# Patient Record
Sex: Male | Born: 1950
Health system: Southern US, Community
[De-identification: ages and names within clinical notes are randomized; demographics above are authoritative.]

## PROBLEM LIST (undated history)

## (undated) DIAGNOSIS — E7800 Pure hypercholesterolemia, unspecified: Secondary | ICD-10-CM

## (undated) DIAGNOSIS — Z6827 Body mass index (BMI) 27.0-27.9, adult: Secondary | ICD-10-CM

## (undated) DIAGNOSIS — K4090 Unilateral inguinal hernia, without obstruction or gangrene, not specified as recurrent: Secondary | ICD-10-CM

## (undated) DIAGNOSIS — I10 Essential (primary) hypertension: Secondary | ICD-10-CM

## (undated) DIAGNOSIS — M1990 Unspecified osteoarthritis, unspecified site: Secondary | ICD-10-CM

## (undated) DIAGNOSIS — D689 Coagulation defect, unspecified: Secondary | ICD-10-CM

## (undated) DIAGNOSIS — G4730 Sleep apnea, unspecified: Secondary | ICD-10-CM

## (undated) DIAGNOSIS — Z801 Family history of malignant neoplasm of trachea, bronchus and lung: Secondary | ICD-10-CM

## (undated) DIAGNOSIS — K5790 Diverticulosis of intestine, part unspecified, without perforation or abscess without bleeding: Secondary | ICD-10-CM

## (undated) DIAGNOSIS — D67 Hereditary factor IX deficiency: Secondary | ICD-10-CM

## (undated) DIAGNOSIS — Z86010 Personal history of colonic polyps: Secondary | ICD-10-CM

## (undated) DIAGNOSIS — Z860101 Personal history of adenomatous and serrated colon polyps: Secondary | ICD-10-CM

## (undated) HISTORY — DX: Unilateral inguinal hernia, without obstruction or gangrene, not specified as recurrent: K40.90

## (undated) HISTORY — DX: Unspecified osteoarthritis, unspecified site: M19.90

## (undated) HISTORY — DX: Hereditary factor IX deficiency: D67

## (undated) HISTORY — DX: Coagulation defect, unspecified: D68.9

## (undated) HISTORY — DX: Essential (primary) hypertension: I10

## (undated) HISTORY — DX: Body mass index (BMI) 27.0-27.9, adult: Z68.27

## (undated) HISTORY — DX: Personal history of adenomatous and serrated colon polyps: Z86.0101

## (undated) HISTORY — DX: Diverticulosis of intestine, part unspecified, without perforation or abscess without bleeding: K57.90

## (undated) HISTORY — PX: COLONOSCOPY: SHX174

## (undated) HISTORY — PX: TONSILLECTOMY: SUR1361

## (undated) HISTORY — DX: Family history of malignant neoplasm of trachea, bronchus and lung: Z80.1

## (undated) HISTORY — PX: POLYPECTOMY: SHX149

## (undated) HISTORY — DX: Personal history of colonic polyps: Z86.010

## (undated) HISTORY — PX: VASECTOMY: SHX75

---

## 1998-09-16 ENCOUNTER — Ambulatory Visit (HOSPITAL_COMMUNITY): Admission: RE | Admit: 1998-09-16 | Discharge: 1998-09-16 | Payer: Self-pay | Admitting: Family Medicine

## 1999-01-27 ENCOUNTER — Encounter: Payer: Self-pay | Admitting: Family Medicine

## 1999-01-27 ENCOUNTER — Ambulatory Visit (HOSPITAL_COMMUNITY): Admission: RE | Admit: 1999-01-27 | Discharge: 1999-01-27 | Payer: Self-pay | Admitting: Family Medicine

## 2004-08-01 ENCOUNTER — Ambulatory Visit: Payer: Self-pay | Admitting: Oncology

## 2004-09-14 ENCOUNTER — Ambulatory Visit (HOSPITAL_COMMUNITY): Admission: RE | Admit: 2004-09-14 | Discharge: 2004-09-14 | Payer: Self-pay | Admitting: Internal Medicine

## 2004-09-14 ENCOUNTER — Ambulatory Visit: Payer: Self-pay | Admitting: Internal Medicine

## 2004-09-14 DIAGNOSIS — K648 Other hemorrhoids: Secondary | ICD-10-CM

## 2004-09-14 HISTORY — DX: Other hemorrhoids: K64.8

## 2004-10-11 ENCOUNTER — Ambulatory Visit: Payer: Self-pay | Admitting: Oncology

## 2005-03-14 ENCOUNTER — Ambulatory Visit: Payer: Self-pay | Admitting: Oncology

## 2007-12-21 ENCOUNTER — Ambulatory Visit: Payer: Self-pay | Admitting: Gastroenterology

## 2008-01-15 ENCOUNTER — Ambulatory Visit: Payer: Self-pay | Admitting: Gastroenterology

## 2008-01-15 ENCOUNTER — Encounter: Payer: Self-pay | Admitting: Gastroenterology

## 2008-01-21 ENCOUNTER — Encounter: Payer: Self-pay | Admitting: Gastroenterology

## 2008-01-27 ENCOUNTER — Ambulatory Visit: Payer: Self-pay | Admitting: Gastroenterology

## 2008-01-27 ENCOUNTER — Telehealth: Payer: Self-pay | Admitting: Gastroenterology

## 2008-01-27 DIAGNOSIS — K644 Residual hemorrhoidal skin tags: Secondary | ICD-10-CM

## 2008-01-27 DIAGNOSIS — Z8639 Personal history of other endocrine, nutritional and metabolic disease: Secondary | ICD-10-CM

## 2008-01-27 DIAGNOSIS — K625 Hemorrhage of anus and rectum: Secondary | ICD-10-CM

## 2008-01-27 DIAGNOSIS — I10 Essential (primary) hypertension: Secondary | ICD-10-CM

## 2008-01-27 DIAGNOSIS — Z862 Personal history of diseases of the blood and blood-forming organs and certain disorders involving the immune mechanism: Secondary | ICD-10-CM | POA: Insufficient documentation

## 2008-01-27 HISTORY — DX: Essential (primary) hypertension: I10

## 2008-01-27 HISTORY — DX: Hemorrhage of anus and rectum: K62.5

## 2008-01-27 HISTORY — DX: Residual hemorrhoidal skin tags: K64.4

## 2008-01-27 LAB — CONVERTED CEMR LAB
Basophils Absolute: 0.1 10*3/uL (ref 0.0–0.1)
Basophils Relative: 1.3 % (ref 0.0–3.0)
Eosinophils Absolute: 0.1 10*3/uL (ref 0.0–0.7)
Eosinophils Relative: 1.7 % (ref 0.0–5.0)
HCT: 42 % (ref 39.0–52.0)
Hemoglobin: 14.4 g/dL (ref 13.0–17.0)
Lymphocytes Relative: 29.4 % (ref 12.0–46.0)
MCHC: 34.3 g/dL (ref 30.0–36.0)
MCV: 89.1 fL (ref 78.0–100.0)
Monocytes Absolute: 0.5 10*3/uL (ref 0.1–1.0)
Monocytes Relative: 9.5 % (ref 3.0–12.0)
Neutro Abs: 3 10*3/uL (ref 1.4–7.7)
Neutrophils Relative %: 58.1 % (ref 43.0–77.0)
Platelets: 241 10*3/uL (ref 150–400)
RBC: 4.71 M/uL (ref 4.22–5.81)
RDW: 12.3 % (ref 11.5–14.6)
WBC: 5.2 10*3/uL (ref 4.5–10.5)

## 2010-11-07 ENCOUNTER — Emergency Department (HOSPITAL_COMMUNITY): Payer: 59

## 2010-11-07 ENCOUNTER — Emergency Department (HOSPITAL_COMMUNITY)
Admission: EM | Admit: 2010-11-07 | Discharge: 2010-11-07 | Disposition: A | Discharge status: Home / Self Care | Payer: 59 | Attending: Emergency Medicine | Admitting: Emergency Medicine

## 2010-11-07 DIAGNOSIS — R10819 Abdominal tenderness, unspecified site: Secondary | ICD-10-CM | POA: Insufficient documentation

## 2010-11-07 DIAGNOSIS — R141 Gas pain: Secondary | ICD-10-CM | POA: Insufficient documentation

## 2010-11-07 DIAGNOSIS — R142 Eructation: Secondary | ICD-10-CM | POA: Insufficient documentation

## 2010-11-07 DIAGNOSIS — Z79899 Other long term (current) drug therapy: Secondary | ICD-10-CM | POA: Insufficient documentation

## 2010-11-07 DIAGNOSIS — E7800 Pure hypercholesterolemia, unspecified: Secondary | ICD-10-CM | POA: Insufficient documentation

## 2010-11-07 DIAGNOSIS — R109 Unspecified abdominal pain: Secondary | ICD-10-CM | POA: Insufficient documentation

## 2010-11-07 DIAGNOSIS — I10 Essential (primary) hypertension: Secondary | ICD-10-CM | POA: Insufficient documentation

## 2010-11-07 DIAGNOSIS — M549 Dorsalgia, unspecified: Secondary | ICD-10-CM | POA: Insufficient documentation

## 2010-11-07 LAB — COMPREHENSIVE METABOLIC PANEL
ALT: 27 U/L (ref 0–53)
AST: 23 U/L (ref 0–37)
Alkaline Phosphatase: 63 U/L (ref 39–117)
CO2: 23 mEq/L (ref 19–32)
Chloride: 103 mEq/L (ref 96–112)
Creatinine, Ser: 0.98 mg/dL (ref 0.50–1.35)
GFR calc non Af Amer: 89 mL/min — ABNORMAL LOW (ref >90)
Total Bilirubin: 1.6 mg/dL — ABNORMAL HIGH (ref 0.3–1.2)

## 2010-11-07 LAB — DIFFERENTIAL
Basophils Absolute: 0 10*3/uL (ref 0.0–0.1)
Basophils Relative: 0 % (ref 0–1)
Eosinophils Absolute: 0.1 10*3/uL (ref 0.0–0.7)
Eosinophils Relative: 1 % (ref 0–5)
Lymphocytes Relative: 11 % — ABNORMAL LOW (ref 12–46)
Lymphs Abs: 1.1 10*3/uL (ref 0.7–4.0)
Monocytes Absolute: 0.6 10*3/uL (ref 0.1–1.0)
Monocytes Relative: 6 % (ref 3–12)
Neutro Abs: 8.2 10*3/uL — ABNORMAL HIGH (ref 1.7–7.7)
Neutrophils Relative %: 82 % — ABNORMAL HIGH (ref 43–77)

## 2010-11-07 LAB — CBC
MCV: 87.7 fL (ref 78.0–100.0)
Platelets: 239 10*3/uL (ref 150–400)
RBC: 5.14 MIL/uL (ref 4.22–5.81)
WBC: 10 10*3/uL (ref 4.0–10.5)

## 2010-11-07 LAB — URINALYSIS, ROUTINE W REFLEX MICROSCOPIC
Bilirubin Urine: NEGATIVE
Glucose, UA: NEGATIVE mg/dL
Hgb urine dipstick: NEGATIVE
Ketones, ur: NEGATIVE mg/dL
Leukocytes, UA: NEGATIVE
Nitrite: NEGATIVE
Protein, ur: NEGATIVE mg/dL
Specific Gravity, Urine: 1.021 (ref 1.005–1.030)
Urobilinogen, UA: 0.2 mg/dL (ref 0.0–1.0)
pH: 6 (ref 5.0–8.0)

## 2010-11-07 MED ORDER — IOHEXOL 300 MG/ML  SOLN
80.0000 mL | Freq: Once | INTRAMUSCULAR | Status: AC | PRN
  Administered 2010-11-07: 80 mL via INTRAVENOUS

## 2010-11-09 ENCOUNTER — Emergency Department (HOSPITAL_COMMUNITY)
Admission: EM | Admit: 2010-11-09 | Discharge: 2010-11-09 | Disposition: A | Discharge status: Home / Self Care | Payer: 59 | Attending: Emergency Medicine | Admitting: Emergency Medicine

## 2010-11-09 DIAGNOSIS — Z79899 Other long term (current) drug therapy: Secondary | ICD-10-CM | POA: Insufficient documentation

## 2010-11-09 DIAGNOSIS — E7800 Pure hypercholesterolemia, unspecified: Secondary | ICD-10-CM | POA: Insufficient documentation

## 2010-11-09 DIAGNOSIS — R109 Unspecified abdominal pain: Secondary | ICD-10-CM | POA: Insufficient documentation

## 2010-11-09 DIAGNOSIS — I10 Essential (primary) hypertension: Secondary | ICD-10-CM | POA: Insufficient documentation

## 2010-11-11 ENCOUNTER — Encounter: Payer: Self-pay | Admitting: *Deleted

## 2010-11-11 ENCOUNTER — Emergency Department (HOSPITAL_COMMUNITY)
Admission: EM | Admit: 2010-11-11 | Discharge: 2010-11-11 | Disposition: A | Discharge status: Home / Self Care | Payer: 59 | Attending: Emergency Medicine | Admitting: Emergency Medicine

## 2010-11-11 ENCOUNTER — Emergency Department (HOSPITAL_COMMUNITY): Payer: 59

## 2010-11-11 DIAGNOSIS — R109 Unspecified abdominal pain: Secondary | ICD-10-CM | POA: Insufficient documentation

## 2010-11-11 DIAGNOSIS — M549 Dorsalgia, unspecified: Secondary | ICD-10-CM | POA: Insufficient documentation

## 2010-11-11 DIAGNOSIS — K2970 Gastritis, unspecified, without bleeding: Secondary | ICD-10-CM | POA: Insufficient documentation

## 2010-11-11 DIAGNOSIS — K219 Gastro-esophageal reflux disease without esophagitis: Secondary | ICD-10-CM | POA: Insufficient documentation

## 2010-11-11 DIAGNOSIS — K828 Other specified diseases of gallbladder: Secondary | ICD-10-CM

## 2010-11-11 DIAGNOSIS — E7800 Pure hypercholesterolemia, unspecified: Secondary | ICD-10-CM | POA: Insufficient documentation

## 2010-11-11 DIAGNOSIS — I10 Essential (primary) hypertension: Secondary | ICD-10-CM | POA: Insufficient documentation

## 2010-11-11 DIAGNOSIS — K8020 Calculus of gallbladder without cholecystitis without obstruction: Secondary | ICD-10-CM | POA: Insufficient documentation

## 2010-11-11 DIAGNOSIS — Z79899 Other long term (current) drug therapy: Secondary | ICD-10-CM | POA: Insufficient documentation

## 2010-11-11 DIAGNOSIS — R110 Nausea: Secondary | ICD-10-CM | POA: Insufficient documentation

## 2010-11-11 HISTORY — DX: Essential (primary) hypertension: I10

## 2010-11-11 HISTORY — DX: Pure hypercholesterolemia, unspecified: E78.00

## 2010-11-11 LAB — DIFFERENTIAL
Basophils Absolute: 0 10*3/uL (ref 0.0–0.1)
Eosinophils Relative: 2 % (ref 0–5)
Monocytes Absolute: 1.3 10*3/uL — ABNORMAL HIGH (ref 0.1–1.0)
Neutrophils Relative %: 73 % (ref 43–77)

## 2010-11-11 LAB — COMPREHENSIVE METABOLIC PANEL
AST: 15 U/L (ref 0–37)
BUN: 11 mg/dL (ref 6–23)
CO2: 31 mEq/L (ref 19–32)
Chloride: 99 mEq/L (ref 96–112)
Creatinine, Ser: 1.11 mg/dL (ref 0.50–1.35)
GFR calc Af Amer: 83 mL/min — ABNORMAL LOW (ref >90)
GFR calc non Af Amer: 71 mL/min — ABNORMAL LOW (ref >90)
Glucose, Bld: 119 mg/dL — ABNORMAL HIGH (ref 70–99)
Total Bilirubin: 1.6 mg/dL — ABNORMAL HIGH (ref 0.3–1.2)

## 2010-11-11 LAB — URINALYSIS, ROUTINE W REFLEX MICROSCOPIC
Glucose, UA: NEGATIVE mg/dL
Ketones, ur: NEGATIVE mg/dL
Leukocytes, UA: NEGATIVE
Protein, ur: NEGATIVE mg/dL
Urobilinogen, UA: 0.2 mg/dL (ref 0.0–1.0)

## 2010-11-11 LAB — CBC
MCH: 30.8 pg (ref 26.0–34.0)
MCHC: 35.1 g/dL (ref 30.0–36.0)
Platelets: 230 10*3/uL (ref 150–400)
RDW: 12.5 % (ref 11.5–15.5)

## 2010-11-11 MED ORDER — FAMOTIDINE IN NACL 20-0.9 MG/50ML-% IV SOLN
20.0000 mg | Freq: Once | INTRAVENOUS | Status: AC
  Administered 2010-11-11: 20 mg via INTRAVENOUS
  Filled 2010-11-11: qty 50

## 2010-11-11 MED ORDER — FAMOTIDINE 20 MG PO TABS: 20.0000 mg | ORAL_TABLET | Freq: Two times a day (BID) | ORAL | Status: DC

## 2010-11-11 MED ORDER — HYDROMORPHONE HCL PF 1 MG/ML IJ SOLN
1.0000 mg | Freq: Once | INTRAMUSCULAR | Status: AC
  Administered 2010-11-11: 1 mg via INTRAVENOUS
  Filled 2010-11-11: qty 1

## 2010-11-11 MED ORDER — GI COCKTAIL ~~LOC~~
30.0000 mL | Freq: Once | ORAL | Status: AC
  Administered 2010-11-11: 30 mL via ORAL
  Filled 2010-11-11: qty 30

## 2010-11-11 MED ORDER — OXYCODONE-ACETAMINOPHEN 5-325 MG PO TABS: 2.0000 | ORAL_TABLET | ORAL | Status: DC | PRN

## 2010-11-11 MED ORDER — PANTOPRAZOLE SODIUM 40 MG IV SOLR
40.0000 mg | Freq: Once | INTRAVENOUS | Status: AC
  Administered 2010-11-11: 40 mg via INTRAVENOUS
  Filled 2010-11-11: qty 40

## 2010-11-11 MED ORDER — POTASSIUM CHLORIDE CRYS ER 20 MEQ PO TBCR
40.0000 meq | EXTENDED_RELEASE_TABLET | Freq: Once | ORAL | Status: AC
  Administered 2010-11-11: 40 meq via ORAL
  Filled 2010-11-11: qty 2

## 2010-11-11 MED ORDER — OMEPRAZOLE 20 MG PO CPDR: 20.0000 mg | DELAYED_RELEASE_CAPSULE | Freq: Every day | ORAL | Status: DC

## 2010-11-11 NOTE — ED Notes (Signed)
Bowel sounds active in all quadrants. Pt c/o "bloating".

## 2010-11-11 NOTE — ED Notes (Signed)
Family at bedside. 

## 2010-11-11 NOTE — ED Notes (Signed)
MD at bedside. 

## 2010-11-11 NOTE — ED Notes (Signed)
Pt from triage to room #9. C/o abd pain since Tuesday with 2 previous trips to ED here and CT done. Had BM yesterday after taking laxative, dark brown formed with no blood. No BM today. Pain level 5/10 1 hour ago and took Rx from home oxycodone/acetaminophen 5/325 on own. Pain level now 2/10 in upper abd with radiation to back on both sides. C/o feeling bloated.

## 2010-11-11 NOTE — ED Notes (Signed)
Pt A&O x 4, Eyes PERRL, Skin warm and dry. Family at bedside, and toileting offered

## 2010-11-11 NOTE — ED Notes (Signed)
Was here wed for same, having abd pain and back pain, constipation, had ct scan done on wed.

## 2010-11-11 NOTE — Discharge Instructions (Signed)
Gastritis Gastritis is an inflammation (the body's way of reacting to injury and/or infection) of the stomach. It is often caused by viral or bacterial (germ) infections. It can also be caused by chemicals (including alcohol) and medications. This illness may be associated with generalized malaise (feeling tired, not well), cramps, and fever. The illness may last 2 to 7 days. If symptoms of gastritis continue, gastroscopy (looking into the stomach with a telescope-like instrument), biopsy (taking tissue samples), and/or blood tests may be necessary to determine the cause. Antibiotics will not affect the illness unless there is a bacterial infection present. One common bacterial cause of gastritis is an organism known as H. Pylori. This can be treated with antibiotics. Other forms of gastritis are caused by too much acid in the stomach. They can be treated with medications such as H2 blockers and antacids. Home treatment is usually all that is needed. Young children will quickly become dehydrated (loss of body fluids) if vomiting and diarrhea are both present. Medications may be given to control nausea. Medications are usually not given for diarrhea unless especially bothersome. Some medications slow the removal of the virus from the gastrointestinal tract. This slows down the healing process. HOME CARE INSTRUCTIONS Home care instructions for nausea and vomiting:  For adults: drink small amounts of fluids often. Drink at least 2 quarts a day. Take sips frequently. Do not drink large amounts of fluid at one time. This may worsen the nausea.   Only take over-the-counter or prescription medicines for pain, discomfort, or fever as directed by your caregiver.   Drink clear liquids only. Those are anything you can see through such as water, broth, or soft drinks.   Once you are keeping clear liquids down, you may start full liquids, soups, juices, and ice cream or sherbet. Slowly add bland (plain, not spicy)  foods to your diet.  Home care instructions for diarrhea:  Diarrhea can be caused by bacterial infections or a virus. Your condition should improve with time, rest, fluids, and/or anti-diarrheal medication.   Until your diarrhea is under control, you should drink clear liquids often in small amounts. Clear liquids include: water, broth, jell-o water and weak tea.  Avoid:  Milk.   Fruits.   Tobacco.   Alcohol.   Extremely hot or cold fluids.   Too much intake of anything at one time.  When your diarrhea stops you may add the following foods, which help the stool to become more formed:  Rice.   Bananas.   Apples without skin.   Dry toast.  Once these foods are tolerated you may add low-fat yogurt and low-fat cottage cheese. They will help to restore the normal bacterial balance in your bowel. Wash your hands well to avoid spreading bacteria (germ) or virus. SEEK IMMEDIATE MEDICAL CARE IF:   You are unable to keep fluids down.   Vomiting or diarrhea become persistent (constant).   Abdominal pain develops, increases, or localizes. (Right sided pain can be appendicitis. Left sided pain in adults can be diverticulitis.)   You develop a fever (an oral temperature above 102 F (38.9 C)).   Diarrhea becomes excessive or contains blood or mucus.   You have excessive weakness, dizziness, fainting or extreme thirst.   You are not improving or you are getting worse.   You have any other questions or concerns.  Document Released: 12/18/2000 Document Revised: 09/05/2010 Document Reviewed: 12/24/2004 [facility] Patient Information 2012 [place], [place].  Buy an entire 256 g bottle of MiraLAX  and insert the entire thing into a 64 ounce Gatorade and drink the entire thing to resolve your constipation. After completing this take MiraLAX once or twice daily

## 2010-11-12 NOTE — ED Provider Notes (Signed)
History     CSN: [id number] Arrival date & time: 11/11/2010  3:58 PM   First MD Initiated Contact with Patient 11/11/10 1757      Chief Complaint  Patient presents with  . Abdominal Pain  . Back Pain    (Consider location/radiation/quality/duration/timing/severity/associated sxs/prior treatment) HPI Comments: Patient has been seen 2 times prior in the emergency department for this complaint. He's had a CT scan which was negative. Has been taking Percocet with some improvement of the symptoms. Presents for further evaluation to determine the exact etiology of abdominal pain. States he does have some constipation.  Patient is a 60 y.o. male presenting with abdominal pain. The history is provided by the patient. No language interpreter was used.  Abdominal Pain The primary symptoms of the illness include abdominal pain and nausea. The primary symptoms of the illness do not include fatigue, shortness of breath, vomiting, diarrhea or dysuria. The current episode started more than 2 days ago (1 week ago). The onset of the illness was gradual. The problem has been gradually worsening.  The abdominal pain began more than 2 days ago. The pain came on gradually. The abdominal pain has been gradually worsening since its onset. The abdominal pain is located in the epigastric region. The abdominal pain radiates to the back. The abdominal pain is relieved by nothing. The abdominal pain is exacerbated by certain positions.  The patient has not had a change in bowel habit. Additional symptoms associated with the illness include back pain. Symptoms associated with the illness do not include chills, anorexia, diaphoresis, constipation, urgency or frequency. Significant associated medical issues include GERD. Significant associated medical issues do not include inflammatory bowel disease, diabetes, gallstones, liver disease or cardiac disease.    Past Medical History  Diagnosis Date  . High cholesterol   .  Hypertension     History reviewed. No pertinent past surgical history.  History reviewed. No pertinent family history.  History  Substance Use Topics  . Smoking status: Never Smoker   . Smokeless tobacco: Not on file  . Alcohol Use: 6.0 oz/week    10 Glasses of wine per week      Review of Systems  Constitutional: Negative for chills, diaphoresis, activity change, appetite change and fatigue.  HENT: Negative for congestion, sore throat, rhinorrhea, neck pain and neck stiffness.   Respiratory: Negative for cough and shortness of breath.   Cardiovascular: Negative for chest pain and palpitations.  Gastrointestinal: Positive for nausea and abdominal pain. Negative for vomiting, diarrhea, constipation, abdominal distention and anorexia.  Genitourinary: Negative for dysuria, urgency, frequency and flank pain.  Musculoskeletal: Positive for back pain. Negative for myalgias and arthralgias.  Neurological: Negative for dizziness, weakness, light-headedness, numbness and headaches.  All other systems reviewed and are negative.    Allergies  Penicillins  Home Medications   Current Outpatient Rx  Name Route Sig Dispense Refill  . ATORVASTATIN CALCIUM 40 MG PO TABS Oral Take 40 mg by mouth every morning.      . OXYCODONE-ACETAMINOPHEN 5-325 MG PO TABS Oral Take 1-2 tablets by mouth every 6 (six) hours as needed. For pain.     Marland Kitchen FAMOTIDINE 20 MG PO TABS Oral Take 1 tablet (20 mg total) by mouth 2 (two) times daily. 60 tablet 0  . OMEPRAZOLE 20 MG PO CPDR Oral Take 1 capsule (20 mg total) by mouth daily. 30 capsule 0  . OXYCODONE-ACETAMINOPHEN 5-325 MG PO TABS Oral Take 2 tablets by mouth every 4 (four) hours as  needed for pain. 15 tablet 0    BP 153/79  Pulse 70  Temp(Src) 98.8 F (37.1 C) (Oral)  Resp 20  SpO2 94%  Physical Exam  Nursing note and vitals reviewed. Constitutional: He is oriented to person, place, and time. He appears well-developed and well-nourished. He  appears distressed.  HENT:  Head: Normocephalic and atraumatic.  Mouth/Throat: Oropharynx is clear and moist. No oropharyngeal exudate.  Eyes: Conjunctivae and EOM are normal. Pupils are equal, round, and reactive to light.  Neck: Normal range of motion. Neck supple.  Cardiovascular: Normal rate, regular rhythm, normal heart sounds and intact distal pulses.  Exam reveals no gallop and no friction rub.   No murmur heard. Pulmonary/Chest: Effort normal and breath sounds normal. He exhibits no tenderness.  Abdominal: Soft. Bowel sounds are normal. There is tenderness (RUQ and epigastric tenderness). There is no rebound and no guarding.  Musculoskeletal: Normal range of motion. He exhibits no tenderness.  Neurological: He is alert and oriented to person, place, and time.  Skin: Skin is warm and dry.    ED Course  Procedures (including critical care time)  Labs Reviewed  DIFFERENTIAL - Abnormal; Notable for the following:    Lymphocytes Relative 11 (*)    Monocytes Relative 14 (*)    Monocytes Absolute 1.3 (*)    All other components within normal limits  COMPREHENSIVE METABOLIC PANEL - Abnormal; Notable for the following:    Potassium 3.0 (*)    Glucose, Bld 119 (*)    Total Bilirubin 1.6 (*)    GFR calc non Af Amer 71 (*)    GFR calc Af Amer 83 (*)    All other components within normal limits  URINALYSIS, ROUTINE W REFLEX MICROSCOPIC - Abnormal; Notable for the following:    Appearance TURBID (*)    All other components within normal limits  CBC  LIPASE, BLOOD  URINE MICROSCOPIC-ADD ON   US Abdomen Complete  11/11/2010  *RADIOLOGY REPORT*  Clinical Data:  Abdominal pain  ABDOMINAL ULTRASOUND COMPLETE  Comparison:  None.  Findings:  Gallbladder:  Gallbladder sludge is present.  Hyperechoic foci are present within the sludge likely representing cholesterol crystals. No shadowing stones are identified.  Common Bile Duct:  Within normal limits in caliber.  Liver: No focal mass lesion  identified.  Within normal limits in parenchymal echogenicity.  IVC:  Appears normal.  Pancreas:  No abnormality identified.  Spleen:  Within normal limits in size and echotexture.  Right kidney:  13.1 cm in length.  No hydronephrosis or mass. Scarring in the lower pole is noted.  Left kidney:  Normal in size and parenchymal echogenicity.  No evidence of mass or hydronephrosis.  Abdominal Aorta:  No aneurysm identified.  IMPRESSION: Gallbladder sludge containing hyperechoic foci most consistent with cholesterol crystals.  No definite shadowing gallstones.  Original Report Authenticated By: Donavan Burnet, M.D.     1. Gastritis   2. Gallbladder sludge       MDM  I reviewed the patient's CT scan which was negative. A repeat laboratory studies which are relatively unremarkable. I did replace his potassium. I performed a gallbladder scan to rule out cholecystitis get her quadrant pain. Did show sludge but no evidence of cholecystitis. Axilla symptoms are likely secondary to gastritis. I have no concern for cardiac etiology of his pain. He received a GI cocktail, this a pain medication, PPI and H2 blocker. He'll be discharged home with treatment of gastritis. I also normal limits prescription for  Percocet with instructions to followup with his primary care physician        Dayton Bailiff, MD 11/12/10 [phone]

## 2010-11-21 ENCOUNTER — Encounter (INDEPENDENT_AMBULATORY_CARE_PROVIDER_SITE_OTHER): Payer: Self-pay | Admitting: General Surgery

## 2010-11-21 ENCOUNTER — Ambulatory Visit (INDEPENDENT_AMBULATORY_CARE_PROVIDER_SITE_OTHER): Payer: 59 | Admitting: General Surgery

## 2010-11-21 VITALS — BP 138/88 | HR 80 | Temp 96.6°F | Resp 20 | Ht 67.0 in | Wt 169.0 lb

## 2010-11-21 DIAGNOSIS — D699 Hemorrhagic condition, unspecified: Secondary | ICD-10-CM

## 2010-11-21 DIAGNOSIS — R1013 Epigastric pain: Secondary | ICD-10-CM

## 2010-11-21 HISTORY — DX: Epigastric pain: R10.13

## 2010-11-21 NOTE — Patient Instructions (Signed)
Strict nonfat to lowfat diet. 

## 2010-11-21 NOTE — Progress Notes (Signed)
Chief Complaint  Patient presents with  . New Evaluation    eval of GB with sludge and possible gallstones     HPI David Chen is a 60 y.o. male.   HPI  He is referred by Dr. Desmond Chen for further evaluation of epigastric pain and gallbladder sludge with possible stones. He has had three episodes of postprandial epigastric pain radiating to the back with mild nausea.  He has been evaluated in the ED three times for this.  An US demonstrated gallbladder sludge and possible stones. CT scan was negative.  T. Bilirubin was 1.6, but other LFTs/WBC/Lipase were normal.   Past Medical History  Diagnosis Date  . High cholesterol   . Hypertension   . Asthma   . Clotting disorder     Past Surgical History  Procedure Date  . Colonoscopy     History reviewed. No pertinent family history.  Social History History  Substance Use Topics  . Smoking status: Never Smoker   . Smokeless tobacco: Never Used  . Alcohol Use: 3.6 oz/week    6 Glasses of wine per week    Allergies  Allergen Reactions  . Penicillins     REACTION: hives    Current Outpatient Prescriptions  Medication Sig Dispense Refill  . azithromycin (ZITHROMAX) 250 MG tablet Take 250 mg by mouth daily. Started 11/21/10       . famotidine (PEPCID) 20 MG tablet Take 1 tablet (20 mg total) by mouth 2 (two) times daily.  60 tablet  0  . FLUTICASONE PROPIONATE  HFA IN Inhale into the lungs daily.        Marland Kitchen HYDROcodone-acetaminophen (VICODIN) 5-500 MG per tablet Take 1 tablet by mouth every 6 (six) hours as needed.        . magnesium citrate 1.745 GM/30ML SOLN Take 296 mLs by mouth once.        Marland Kitchen omeprazole (PRILOSEC) 20 MG capsule Take 1 capsule (20 mg total) by mouth daily.  30 capsule  0  . atorvastatin (LIPITOR) 40 MG tablet Take 40 mg by mouth every morning.          Review of Systems Review of Systems  Constitutional: Positive for appetite change and unexpected weight change (loss). Negative for fever.  HENT: Positive  for sinus pressure (taking Goody powders an Z-Pak for this).   Respiratory: Negative.   Cardiovascular: Negative.   Gastrointestinal: Positive for constipation.  Genitourinary: Negative.   Neurological: Positive for weakness.  Hematological: Bruises/bleeds easily (had a significant post colonoscopic post polypectomy bleed; reports having a prolonged bleeding time in the past; has seen Dr. Juliette Chen for this in 2006 by his report).    Blood pressure 138/88, pulse 80, temperature 96.6 F (35.9 C), temperature source Temporal, resp. rate 20, height 5\' 7"  (1.702 m), weight 169 lb (76.658 kg).  Physical Exam Physical Exam  Constitutional: He appears well-developed and well-nourished. No distress.  HENT:  Head: Normocephalic and atraumatic.  Eyes: Conjunctivae and EOM are normal.  Neck: Normal range of motion.  Cardiovascular: Normal rate and regular rhythm.   No murmur heard. Pulmonary/Chest: Effort normal and breath sounds normal.  Abdominal: Soft. He exhibits no distension and no mass. There is no tenderness.  Musculoskeletal: Normal range of motion. He exhibits no edema.  Lymphadenopathy:    He has no cervical adenopathy.  Skin: Skin is warm and dry.  Psychiatric: He has a normal mood and affect. His behavior is normal.    Data Reviewed CT,  [place], Labs, Dr. Glenetta Chen note  Assessment    1.  Biliary colic likely secondary to small gallstones.  2. Undefined bleeding disorder    Plan    Referral to Hematology for further workup of bleeding disorder prior to scheduling an operation.  Strict lowfat diet  Elective laparoscopic cholecystectomy.  I have explained the procedure, risks, and aftercare of cholecystectomy.  Risks include but are not limited to bleeding, infection, wound problems, anesthesia, diarrhea, bile leak, injury to common bile duct/liver/intestine.  He seems to understand.        David Chen J 11/21/2010, 7:31 PM

## 2010-11-22 ENCOUNTER — Telehealth: Payer: Self-pay | Admitting: Oncology

## 2010-11-22 ENCOUNTER — Other Ambulatory Visit: Payer: Self-pay | Admitting: Oncology

## 2010-11-22 DIAGNOSIS — R58 Hemorrhage, not elsewhere classified: Secondary | ICD-10-CM

## 2010-11-22 NOTE — Telephone Encounter (Signed)
lmonvm for pt re appt for 11/21 @ 9 am.

## 2010-11-23 ENCOUNTER — Ambulatory Visit (INDEPENDENT_AMBULATORY_CARE_PROVIDER_SITE_OTHER): Payer: 59 | Admitting: General Surgery

## 2010-11-28 ENCOUNTER — Other Ambulatory Visit: Payer: Self-pay | Admitting: *Deleted

## 2010-11-28 ENCOUNTER — Ambulatory Visit (HOSPITAL_BASED_OUTPATIENT_CLINIC_OR_DEPARTMENT_OTHER): Payer: 59 | Admitting: Oncology

## 2010-11-28 ENCOUNTER — Other Ambulatory Visit (HOSPITAL_BASED_OUTPATIENT_CLINIC_OR_DEPARTMENT_OTHER): Payer: Self-pay

## 2010-11-28 ENCOUNTER — Encounter: Payer: Self-pay | Admitting: *Deleted

## 2010-11-28 VITALS — BP 155/99 | HR 69 | Temp 99.7°F | Ht 67.0 in | Wt 171.0 lb

## 2010-11-28 DIAGNOSIS — R791 Abnormal coagulation profile: Secondary | ICD-10-CM

## 2010-11-28 DIAGNOSIS — R58 Hemorrhage, not elsewhere classified: Secondary | ICD-10-CM

## 2010-11-28 DIAGNOSIS — K8020 Calculus of gallbladder without cholecystitis without obstruction: Secondary | ICD-10-CM

## 2010-11-28 LAB — CBC WITH DIFFERENTIAL/PLATELET
Basophils Absolute: 0 10*3/uL (ref 0.0–0.1)
Eosinophils Absolute: 0.1 10*3/uL (ref 0.0–0.5)
HGB: 13.8 g/dL (ref 13.0–17.1)
NEUT#: 4.3 10*3/uL (ref 1.5–6.5)
RDW: 12.3 % (ref 11.0–14.6)
lymph#: 1.1 10*3/uL (ref 0.9–3.3)

## 2010-11-28 LAB — PROTIME-INR: INR: 1.1 — ABNORMAL LOW (ref 2.00–3.50)

## 2010-11-28 NOTE — Progress Notes (Signed)
Hematology and Oncology Follow Up Visit  David Chen [id number] [birth year] 60 y.o. 11/28/2010 10:27 AM   Dr. Suzzanne Cloud - [facility]   Principle Diagnosis: This is a 47 -year-old gentleman with no significant past medical history who I saw for the first time for a history of bleeding and for an evaluation prior to a colonoscopy in 2006. 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   Interim History: This is a 89 -year-old gentleman, as mentioned above.  I have worked him up for a bleeding disorder back on 2006 .  At that time his PT was normal with an INR of 0.9.  PTT was marginally abnormal at 38.  He had a normal bleeding time.  I also checked von Willebrand's disease which was normal.  The patient reported he had a colonoscopy and he did have some post- polypectomy oozing that required hospitalization but did not require any blood transfusion.  The patient, clinically, reports he is feeling well.  He is completely asymptomatic at this point.  He still reports some excessive bruisability and some bleeding when he uses a regular razor.  The patient has had dental work in the past and did require extra packing for bleeding.  He had normal platelets and normal platelet function.  Clinically, again, he is currently feeling well.  He did report RUQ pain and was diagnosed with gall stones and he is anticipating having gall bladder surgery in the near future. Patient report eating liquid and low fat diet to avoid post prandial pain.  Medications: I have reviewed the patient's current medications. Current outpatient prescriptions:atorvastatin (LIPITOR) 40 MG tablet, Take 40 mg by mouth every morning.  , Disp: , Rfl: ;  FLUTICASONE PROPIONATE  HFA IN, Inhale into the lungs daily.  , Disp: , Rfl: ;  magnesium citrate 1.745 GM/30ML SOLN, Take 296 mLs by mouth once.  , Disp: , Rfl: ;  azithromycin (ZITHROMAX) 250 MG tablet, Take 250 mg by mouth daily. Started 11/21/10 , Disp: , Rfl:  famotidine  (PEPCID) 20 MG tablet, Take 1 tablet (20 mg total) by mouth 2 (two) times daily., Disp: 60 tablet, Rfl: 0;  HYDROcodone-acetaminophen (VICODIN) 5-500 MG per tablet, Take 1 tablet by mouth every 6 (six) hours as needed.  , Disp: , Rfl: ;  omeprazole (PRILOSEC) 20 MG capsule, Take 1 capsule (20 mg total) by mouth daily., Disp: 30 capsule, Rfl: 0  Allergies:  Allergies  Allergen Reactions  . Penicillins     REACTION: hives    Past Medical History, Surgical history, Social history, and Family History were reviewed and updated.  Review of Systems: Constitutional:  Negative for fever, chills, night sweats, anorexia, weight loss, pain. Cardiovascular: no chest pain or dyspnea on exertion Respiratory: no cough, shortness of breath, or wheezing Neurological: no TIA or stroke symptoms Dermatological: negative ENT: negative Skin:neagtive Gastrointestinal: no abdominal pain, change in bowel habits, or black or bloody stools Genito-Urinary: no dysuria, trouble voiding, or hematuria Hematological and Lymphatic: negative Breast: negative Musculoskeletal: negative Remaining ROS negative. Physical Exam: Blood pressure 155/99, pulse 69, temperature 99.7 F (37.6 C), temperature source Oral, height 5\' 7"  (1.702 m), weight 171 lb (77.565 kg). ECOG: 0 General appearance: alert Head: Normocephalic, without obvious abnormality, atraumatic Neck: no adenopathy, no carotid bruit, no JVD, supple, symmetrical, trachea midline and thyroid not enlarged, symmetric, no tenderness/mass/nodules Lymph nodes: Cervical, supraclavicular, and axillary nodes normal. Heart:regular rate and rhythm, S1, S2 normal, no murmur, click, rub or gallop Lung:chest clear, no  wheezing, rales, normal symmetric air entry, Heart exam - S1, S2 normal, no murmur, no gallop, rate regular Abdomin: soft, non-tender, without masses or organomegaly EXT:no erythema, induration, or nodules   Lab Results: Lab Results  Component Value Date     WBC 5.9 11/28/2010   HGB 13.8 11/28/2010   HCT 40.8 11/28/2010   MCV 87.9 11/28/2010   PLT 365 11/28/2010     Chemistry      Component Value Date/Time   NA 140 11/11/2010 1834   K 3.0* 11/11/2010 1834   CL 99 11/11/2010 1834   CO2 31 11/11/2010 1834   BUN 11 11/11/2010 1834   CREATININE 1.11 11/11/2010 1834      Component Value Date/Time   CALCIUM 9.3 11/11/2010 1834   ALKPHOS 61 11/11/2010 1834   AST 15 11/11/2010 1834   ALT 13 11/11/2010 1834   BILITOT 1.6* 11/11/2010 1834         Impression and Plan:  This is a 40 -year-old gentleman with clinically evidence of a mild bleeding disorder.  However, none of the laboratory findings were able to quantify it.  His PTT is mildly elevated.  He had a negative von Willebrand's screen in 2006. I am repeating his blood work up today. I doubt he has an inherited  bleeding disorder. Medication related bleeding (BC powder which he uses a lot) can be the cause. Other possibilities represent a mild hemophilia, again this is very unlikely.  A factor VIII inhibitor but the factor VIII activity does not suggest this. Factor XI deficiency which has contributed to an elevated PTT, as well as him mild postoperative bleeding could be the cause as well.  It is a rare autosomal recessive disorder that could quite possibly be contriubting to his bleeding.  After his work up is done, I will communicate the findins to his  Dr. Abbey Chatters as well as my reconditions.   I instructed him to stop the [facility] powder as well as all antiinflammatory medication.  Likely he will need post-op observation and my be FFP during the procedure as precautionary measure.      [facility], MD 11/21/201210:27 AM

## 2010-11-30 ENCOUNTER — Other Ambulatory Visit: Payer: Self-pay | Admitting: Oncology

## 2010-12-03 ENCOUNTER — Other Ambulatory Visit: Payer: Self-pay | Admitting: Oncology

## 2010-12-03 ENCOUNTER — Ambulatory Visit (INDEPENDENT_AMBULATORY_CARE_PROVIDER_SITE_OTHER): Payer: Self-pay | Admitting: Surgery

## 2010-12-03 ENCOUNTER — Telehealth: Payer: Self-pay | Admitting: Oncology

## 2010-12-03 DIAGNOSIS — R791 Abnormal coagulation profile: Secondary | ICD-10-CM

## 2010-12-03 DIAGNOSIS — D699 Hemorrhagic condition, unspecified: Secondary | ICD-10-CM

## 2010-12-03 NOTE — Telephone Encounter (Signed)
lmonvm for pt @ both home/cell re appt for 11/27 @ 11 am.

## 2010-12-04 ENCOUNTER — Other Ambulatory Visit (HOSPITAL_BASED_OUTPATIENT_CLINIC_OR_DEPARTMENT_OTHER): Payer: 59 | Admitting: Lab

## 2010-12-04 DIAGNOSIS — R791 Abnormal coagulation profile: Secondary | ICD-10-CM

## 2010-12-04 DIAGNOSIS — R58 Hemorrhage, not elsewhere classified: Secondary | ICD-10-CM

## 2010-12-05 LAB — VON WILLEBRAND PANEL
Coagulation Factor VIII: 110 % (ref 73–140)
Ristocetin Co-factor, Plasma: 118 % (ref 42–200)
Von Willebrand Antigen, Plasma: 114 % (ref 50–217)

## 2010-12-05 LAB — COMPREHENSIVE METABOLIC PANEL
Albumin: 3.8 g/dL (ref 3.5–5.2)
Alkaline Phosphatase: 64 U/L (ref 39–117)
BUN: 18 mg/dL (ref 6–23)
Calcium: 9.2 mg/dL (ref 8.4–10.5)
Chloride: 106 mEq/L (ref 96–112)
Glucose, Bld: 91 mg/dL (ref 70–99)
Potassium: 3.8 mEq/L (ref 3.5–5.3)

## 2010-12-07 LAB — LUPUS ANTICOAGULANT PANEL
DRVVT 1:1 Mix: 38.9 secs (ref 34.1–42.2)
DRVVT: 51 secs — ABNORMAL HIGH (ref 34.1–42.2)
PTT Lupus Anticoagulant: 56 secs — ABNORMAL HIGH (ref 28.0–43.0)
PTTLA 4:1 Mix: 44.6 secs — ABNORMAL HIGH (ref 28.0–43.0)

## 2010-12-07 LAB — FACTOR 8 INHIBITOR

## 2010-12-12 ENCOUNTER — Other Ambulatory Visit: Payer: Self-pay | Admitting: Oncology

## 2010-12-13 ENCOUNTER — Telehealth (INDEPENDENT_AMBULATORY_CARE_PROVIDER_SITE_OTHER): Payer: Self-pay | Admitting: General Surgery

## 2010-12-13 ENCOUNTER — Other Ambulatory Visit (INDEPENDENT_AMBULATORY_CARE_PROVIDER_SITE_OTHER): Payer: Self-pay | Admitting: General Surgery

## 2010-12-13 NOTE — Telephone Encounter (Signed)
Dr. 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  He wants to proceed with the lap chole after the holidays.  Will get this scheduled and inform Dr. Juliette Alcide so that he can arrange for the transfusions.

## 2010-12-20 ENCOUNTER — Other Ambulatory Visit: Payer: Self-pay | Admitting: Oncology

## 2010-12-24 ENCOUNTER — Other Ambulatory Visit: Payer: Self-pay | Admitting: Oncology

## 2010-12-24 ENCOUNTER — Telehealth: Payer: Self-pay | Admitting: Oncology

## 2010-12-24 NOTE — Telephone Encounter (Signed)
lmonvm for pt re appt for 1/10 @ 9:15 am. Jan schedule mailed today.

## 2010-12-27 ENCOUNTER — Telehealth: Payer: Self-pay | Admitting: *Deleted

## 2010-12-27 ENCOUNTER — Encounter: Payer: Self-pay | Admitting: *Deleted

## 2010-12-27 NOTE — Telephone Encounter (Addendum)
Message on voicemail from pt stating that he has a questions regarding the treatment he is scheduled for on 01/17/11. Chemo appt is 1 hour. Reviewed pts chart unable to find what pt is to get. Notified MD who states he will call the pt

## 2011-01-14 ENCOUNTER — Other Ambulatory Visit: Payer: Self-pay

## 2011-01-14 ENCOUNTER — Encounter (HOSPITAL_COMMUNITY)
Admission: RE | Admit: 2011-01-14 | Discharge: 2011-01-14 | Disposition: A | Discharge status: Home / Self Care | Payer: 59 | Source: Ambulatory Visit | Attending: Anesthesiology | Admitting: Anesthesiology

## 2011-01-14 ENCOUNTER — Encounter (HOSPITAL_COMMUNITY)
Admission: RE | Admit: 2011-01-14 | Discharge: 2011-01-14 | Disposition: A | Discharge status: Home / Self Care | Payer: 59 | Source: Ambulatory Visit | Attending: General Surgery | Admitting: General Surgery

## 2011-01-14 ENCOUNTER — Other Ambulatory Visit: Payer: Self-pay | Admitting: Oncology

## 2011-01-14 ENCOUNTER — Encounter (HOSPITAL_COMMUNITY): Payer: Self-pay | Admitting: *Deleted

## 2011-01-14 ENCOUNTER — Encounter (HOSPITAL_COMMUNITY): Payer: Self-pay | Admitting: Pharmacy Technician

## 2011-01-14 DIAGNOSIS — D689 Coagulation defect, unspecified: Secondary | ICD-10-CM

## 2011-01-14 MED ORDER — COAGULATION FACTOR IX (RECOMB) 250 UNITS IV SOLR: 7446.0000 [IU] | Freq: Once | INTRAVENOUS | Status: DC

## 2011-01-14 NOTE — Progress Notes (Signed)
Pt reports that he has an appt with Dr. Clelia Croft for a transfusion on Thursday for his bleeding disorder. Pt reports that he must use an Neurosurgeon. Spoke to Dr. Noreene Larsson who advised to hold labs until DOS. Will send to anesthesia for review. Pt informed that we will have to repeat his type and screen DOS

## 2011-01-14 NOTE — Pre-Procedure Instructions (Signed)
20 David Chen  01/14/2011   Your procedure is scheduled on:  Jan. 11, 2013  Report to [facility] at 0530 AM.  Call this number if you have problems the morning of surgery: [phone]   Remember:   Do not eat food:After Midnight.  May have clear liquids: up to 4 Hours before arrival.  Clear liquids include soda, tea, black coffee, apple or grape juice, broth.  Take these medicines the morning of surgery with A SIP OF WATER: Paxil   Do not wear jewelry, make-up or nail polish.  Do not wear lotions, powders, or perfumes. You may wear deodorant.  Do not shave 48 hours prior to surgery.  Do not bring valuables to the hospital.  Contacts, dentures or bridgework may not be worn into surgery.  Leave suitcase in the car. After surgery it may be brought to your room.  For patients admitted to the hospital, checkout time is 11:00 AM the day of discharge.   Patients discharged the day of surgery will not be allowed to drive home.  Name and phone number of your driver: David Chen  Special Instructions: CHG Shower Use Special Wash: 1/2 bottle night before surgery and 1/2 bottle morning of surgery.   Please read over the following fact sheets that you were given: Pain Booklet, Coughing and Deep Breathing, Blood Transfusion Information and Surgical Site Infection Prevention

## 2011-01-15 ENCOUNTER — Encounter (HOSPITAL_COMMUNITY): Payer: Self-pay | Admitting: Vascular Surgery

## 2011-01-15 NOTE — Consult Note (Signed)
Anesthesia:  Patient is a 61 year old male scheduled for a laparoscopic cholecystectomy on 01/18/11.  His history is significant for hypercholesterolemia, asthma, former smoker, HTN, OSA, and history of a mild Factor IX deficiency.  Dr. Abbey Chatters referred David Chen to Dr. Juliette Alcide preoperatively who recommended perioperative plasma transfusions.  This is scheduled for 01/17/11.  CXR showed COPD without acute findings.  EKG showed NSR.  He is for labs on the day of surgery.  If reasonable, plan to proceed.

## 2011-01-15 NOTE — Anesthesia Preprocedure Evaluation (Addendum)
Anesthesia Evaluation  Patient identified by MRN, date of birth, ID band Patient awake    Reviewed: Allergy & Precautions, H&P , NPO status , Patient's Chart, lab work & pertinent test results  History of Anesthesia Complications Negative for: history of anesthetic complications  Airway Mallampati: II TM Distance: >3 FB Neck ROM: Full    Dental  (+) Caps, Teeth Intact and Dental Advisory Given   Pulmonary asthma , sleep apnea and Continuous Positive Airway Pressure Ventilation ,  As child clear to auscultation  Pulmonary exam normal       Cardiovascular hypertension, Pt. on medications Regular Normal    Neuro/Psych Negative Neurological ROS  Negative Psych ROS   GI/Hepatic negative GI ROS, Neg liver ROS,   Endo/Other  Negative Endocrine ROS  Renal/GU negative Renal ROS     Musculoskeletal   Abdominal   Peds  Hematology Mild Factor IX deficiency, hematologist prescribes Factor IX for today and tomorrow, received 7664IU yesterday as well   Anesthesia Other Findings   Reproductive/Obstetrics                         Anesthesia Physical Anesthesia Plan  ASA: III  Anesthesia Plan: General   Post-op Pain Management:    Induction: Intravenous  Airway Management Planned: Oral ETT  Additional Equipment:   Intra-op Plan:   Post-operative Plan: Extubation in OR  Informed Consent: I have reviewed the patients History and Physical, chart, labs and discussed the procedure including the risks, benefits and alternatives for the proposed anesthesia with the patient or authorized representative who has indicated his/her understanding and acceptance.   Dental advisory given  Plan Discussed with: CRNA and Surgeon  Anesthesia Plan Comments: (Plan routine monitors, GETA   Factor IX administration today, as per hematology)        Anesthesia Quick Evaluation

## 2011-01-17 ENCOUNTER — Ambulatory Visit: Payer: 59 | Admitting: Oncology

## 2011-01-17 ENCOUNTER — Ambulatory Visit (HOSPITAL_BASED_OUTPATIENT_CLINIC_OR_DEPARTMENT_OTHER): Payer: 59

## 2011-01-17 ENCOUNTER — Telehealth: Payer: Self-pay | Admitting: Oncology

## 2011-01-17 ENCOUNTER — Ambulatory Visit: Payer: 59

## 2011-01-17 VITALS — BP 125/80 | HR 66 | Temp 99.4°F | Ht 67.0 in | Wt 172.5 lb

## 2011-01-17 DIAGNOSIS — Z862 Personal history of diseases of the blood and blood-forming organs and certain disorders involving the immune mechanism: Secondary | ICD-10-CM

## 2011-01-17 DIAGNOSIS — Z8639 Personal history of other endocrine, nutritional and metabolic disease: Secondary | ICD-10-CM

## 2011-01-17 DIAGNOSIS — K648 Other hemorrhoids: Secondary | ICD-10-CM

## 2011-01-17 DIAGNOSIS — R1013 Epigastric pain: Secondary | ICD-10-CM

## 2011-01-17 DIAGNOSIS — K625 Hemorrhage of anus and rectum: Secondary | ICD-10-CM

## 2011-01-17 DIAGNOSIS — D689 Coagulation defect, unspecified: Secondary | ICD-10-CM

## 2011-01-17 DIAGNOSIS — I10 Essential (primary) hypertension: Secondary | ICD-10-CM

## 2011-01-17 DIAGNOSIS — K644 Residual hemorrhoidal skin tags: Secondary | ICD-10-CM

## 2011-01-17 LAB — CBC WITH DIFFERENTIAL/PLATELET
Basophils Absolute: 0 10*3/uL (ref 0.0–0.1)
Eosinophils Absolute: 0 10*3/uL (ref 0.0–0.5)
HCT: 43 % (ref 38.4–49.9)
HGB: 14.8 g/dL (ref 13.0–17.1)
LYMPH%: 27.9 % (ref 14.0–49.0)
MCV: 85.5 fL (ref 79.3–98.0)
MONO#: 0.4 10*3/uL (ref 0.1–0.9)
MONO%: 8 % (ref 0.0–14.0)
NEUT#: 3.1 10*3/uL (ref 1.5–6.5)
NEUT%: 62.7 % (ref 39.0–75.0)
Platelets: 226 10*3/uL (ref 140–400)
WBC: 4.9 10*3/uL (ref 4.0–10.3)

## 2011-01-17 MED ORDER — COAGULATION FACTOR IX (RECOMB) 2000 UNITS IV SOLR
8160.0000 [IU] | Freq: Once | INTRAVENOUS | Status: AC
  Administered 2011-01-17: 8160 [IU] via INTRAVENOUS
  Filled 2011-01-17: qty 8160

## 2011-01-17 MED ORDER — CIPROFLOXACIN IN D5W 400 MG/200ML IV SOLN
400.0000 mg | INTRAVENOUS | Status: AC
  Administered 2011-01-18: 400 mg via INTRAVENOUS
  Filled 2011-01-17: qty 200

## 2011-01-17 NOTE — Progress Notes (Signed)
Hematology and Oncology Follow Up Visit  David Chen [id number] [birth year] 61 y.o. 01/17/2011 10:50 AM   Dr. Suzzanne Cloud - [facility]   Principle Diagnosis: [name] with no significant past medical history who I saw for the first time for a history of bleeding and for an evaluation prior to a colonoscopy in 2006. 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 At this time he has Factor IX deficiency (21% total).    Interim History: [name], as mentioned above.  I have worked him up for a bleeding disorder back on 2006 .  At that time his PT was normal with an INR of 0.9.  PTT was marginally abnormal at 38.  He had a normal bleeding time.  I also checked von Willebrand's disease which was normal.  The patient reported he had a colonoscopy and he did have some post- polypectomy oozing that required hospitalization but did not require any blood transfusion.  The patient, clinically, reports he is feeling well.  He is completely asymptomatic at this point.  He still reports some excessive bruisability and some bleeding when he uses a regular razor.  The patient has had dental work in the past and did require extra packing for bleeding.  He had normal platelets and normal platelet function.  Clinically, again, he is currently feeling well.  He did report RUQ pain and was diagnosed with gall stones and he is anticipating having gall bladder surgery in the near future. Patient report eating liquid and low fat diet to avoid post prandial pain. His most recent work up did reveal a low Factor IX level.   Medications: I have reviewed the patient's current medications. Current outpatient prescriptions:acetaminophen (TYLENOL) 500 MG tablet, Take 1,000 mg by mouth every 6 (six) hours as needed. For pain, Disp: , Rfl: ;  atorvastatin (LIPITOR) 40 MG tablet, Take 40 mg by mouth every morning.  , Disp: , Rfl: ;  azithromycin (ZITHROMAX) 250 MG tablet, Take 250 mg  by mouth daily. Started 11/21/10 , Disp: , Rfl:  famotidine (PEPCID) 20 MG tablet, Take 1 tablet (20 mg total) by mouth 2 (two) times daily., Disp: 60 tablet, Rfl: 0;  fish oil-omega-3 fatty acids 1000 MG capsule, Take 2 g by mouth daily.  , Disp: , Rfl: ;  FLUTICASONE PROPIONATE  HFA IN, Inhale 1 puff into the lungs daily. , Disp: , Rfl: ;  HYDROcodone-acetaminophen (VICODIN) 5-500 MG per tablet, Take 1 tablet by mouth every 6 (six) hours as needed. For pain, Disp: , Rfl:  ibuprofen (ADVIL,MOTRIN) 200 MG tablet, Take 200 mg by mouth every 6 (six) hours as needed. For sinus pain, Disp: , Rfl: ;  magnesium citrate 1.745 GM/30ML SOLN, Take 296 mLs by mouth once.  , Disp: , Rfl: ;  omeprazole (PRILOSEC) 20 MG capsule, Take 1 capsule (20 mg total) by mouth daily., Disp: 30 capsule, Rfl: 0;  PARoxetine (PAXIL-CR) 12.5 MG 24 hr tablet, Take 12.5 mg by mouth every other day. , Disp: , Rfl:  sodium chloride (OCEAN) 0.65 % nasal spray, Place 1 spray into the nose as needed. For nasal drainage, Disp: , Rfl: ;  telmisartan (MICARDIS) 40 MG tablet, Take 40 mg by mouth daily.  , Disp: , Rfl:  No current facility-administered medications for this visit. Facility-Administered Medications Ordered in Other Visits: coagulation factor IX (recomb) (BENEFIX) injection 7,446 Units, 7,446 Units, Intravenous, Once, Eli Hose, MD  Allergies:  Allergies  Allergen  Reactions  . Penicillins     REACTION: hives    Past Medical History, Surgical history, Social history, and Family History were reviewed and updated.  Review of Systems: Constitutional:  Negative for fever, chills, night sweats, anorexia, weight loss, pain. Cardiovascular: no chest pain or dyspnea on exertion Respiratory: no cough, shortness of breath, or wheezing Neurological: no TIA or stroke symptoms Dermatological: negative ENT: negative Skin:neagtive Gastrointestinal: no abdominal pain, change in bowel habits, or black or bloody  stools Genito-Urinary: no dysuria, trouble voiding, or hematuria Hematological and Lymphatic: negative Breast: negative Musculoskeletal: negative Remaining ROS negative. Physical Exam: Blood pressure 125/80, pulse 66, temperature 99.4 F (37.4 C), height 5\' 7"  (1.702 m), weight 172 lb 8 oz (78.245 kg). ECOG: 0 General appearance: alert Head: Normocephalic, without obvious abnormality, atraumatic Neck: no adenopathy, no carotid bruit, no JVD, supple, symmetrical, trachea midline and thyroid not enlarged, symmetric, no tenderness/mass/nodules Lymph nodes: Cervical, supraclavicular, and axillary nodes normal. Heart:regular rate and rhythm, S1, S2 normal, no murmur, click, rub or gallop Lung:chest clear, no wheezing, rales, normal symmetric air entry, Heart exam - S1, S2 normal, no murmur, no gallop, rate regular Abdomin: soft, non-tender, without masses or organomegaly EXT:no erythema, induration, or nodules   Lab Results: Lab Results  Component Value Date   WBC 5.9 11/28/2010   HGB 13.8 11/28/2010   HCT 40.8 11/28/2010   MCV 87.9 11/28/2010   PLT 365 11/28/2010     Chemistry      Component Value Date/Time   NA 143 11/28/2010 0953   K 3.8 11/28/2010 0953   CL 106 11/28/2010 0953   CO2 25 11/28/2010 0953   BUN 18 11/28/2010 0953   CREATININE 0.98 11/28/2010 0953      Component Value Date/Time   CALCIUM 9.2 11/28/2010 0953   ALKPHOS 64 11/28/2010 0953   AST 14 11/28/2010 0953   ALT 14 11/28/2010 0953   BILITOT 0.6 11/28/2010 0953         Impression and Plan:  This is a 38 -year-old gentleman with clinically evidence of a mild bleeding disorder. This is likely Due to low factor IX.  The plan is to give him Factor IX (benefix) to get his Factor levels up to 100% today. A total dose of 7446 IU to be given today and repeated twice on 1/11 and 1/12 as inpatient.  If no bleeding is noted, then no more factor to be given as out patient at this time.  Will follow him as  in patient as well.       David Schroepfer, MD 1/10/201310:50 AM

## 2011-01-17 NOTE — Telephone Encounter (Signed)
Pt sent back to lab b4 inf. No other orders.

## 2011-01-18 ENCOUNTER — Ambulatory Visit (HOSPITAL_COMMUNITY): Payer: 59 | Admitting: Vascular Surgery

## 2011-01-18 ENCOUNTER — Other Ambulatory Visit (INDEPENDENT_AMBULATORY_CARE_PROVIDER_SITE_OTHER): Payer: Self-pay | Admitting: General Surgery

## 2011-01-18 ENCOUNTER — Encounter (HOSPITAL_COMMUNITY): Payer: Self-pay | Admitting: *Deleted

## 2011-01-18 ENCOUNTER — Ambulatory Visit (HOSPITAL_COMMUNITY)
Admission: RE | Admit: 2011-01-18 | Discharge: 2011-01-19 | Disposition: A | Discharge status: Home / Self Care | Payer: 59 | Source: Ambulatory Visit | Attending: General Surgery | Admitting: General Surgery

## 2011-01-18 ENCOUNTER — Ambulatory Visit (HOSPITAL_COMMUNITY): Payer: 59

## 2011-01-18 ENCOUNTER — Encounter (HOSPITAL_COMMUNITY): Payer: Self-pay | Admitting: Vascular Surgery

## 2011-01-18 ENCOUNTER — Encounter (HOSPITAL_COMMUNITY)
Admission: RE | Disposition: A | Discharge status: Home / Self Care | Payer: Self-pay | Source: Ambulatory Visit | Attending: General Surgery

## 2011-01-18 DIAGNOSIS — Z862 Personal history of diseases of the blood and blood-forming organs and certain disorders involving the immune mechanism: Secondary | ICD-10-CM

## 2011-01-18 DIAGNOSIS — K8020 Calculus of gallbladder without cholecystitis without obstruction: Secondary | ICD-10-CM | POA: Insufficient documentation

## 2011-01-18 DIAGNOSIS — I10 Essential (primary) hypertension: Secondary | ICD-10-CM | POA: Insufficient documentation

## 2011-01-18 DIAGNOSIS — K811 Chronic cholecystitis: Secondary | ICD-10-CM

## 2011-01-18 DIAGNOSIS — J45909 Unspecified asthma, uncomplicated: Secondary | ICD-10-CM | POA: Insufficient documentation

## 2011-01-18 DIAGNOSIS — K644 Residual hemorrhoidal skin tags: Secondary | ICD-10-CM

## 2011-01-18 DIAGNOSIS — Z8639 Personal history of other endocrine, nutritional and metabolic disease: Secondary | ICD-10-CM

## 2011-01-18 DIAGNOSIS — D67 Hereditary factor IX deficiency: Secondary | ICD-10-CM | POA: Insufficient documentation

## 2011-01-18 DIAGNOSIS — K625 Hemorrhage of anus and rectum: Secondary | ICD-10-CM

## 2011-01-18 DIAGNOSIS — R1013 Epigastric pain: Secondary | ICD-10-CM

## 2011-01-18 DIAGNOSIS — K648 Other hemorrhoids: Secondary | ICD-10-CM

## 2011-01-18 DIAGNOSIS — D681 Hereditary factor XI deficiency: Secondary | ICD-10-CM

## 2011-01-18 DIAGNOSIS — Z01810 Encounter for preprocedural cardiovascular examination: Secondary | ICD-10-CM | POA: Insufficient documentation

## 2011-01-18 DIAGNOSIS — G4730 Sleep apnea, unspecified: Secondary | ICD-10-CM | POA: Insufficient documentation

## 2011-01-18 DIAGNOSIS — Z01818 Encounter for other preprocedural examination: Secondary | ICD-10-CM | POA: Insufficient documentation

## 2011-01-18 HISTORY — DX: Sleep apnea, unspecified: G47.30

## 2011-01-18 HISTORY — PX: CHOLECYSTECTOMY: SHX55

## 2011-01-18 LAB — COMPREHENSIVE METABOLIC PANEL
AST: 20 U/L (ref 0–37)
BUN: 19 mg/dL (ref 6–23)
CO2: 26 mEq/L (ref 19–32)
CO2: 26 mEq/L (ref 19–32)
Calcium: 9.8 mg/dL (ref 8.4–10.5)
Calcium: 9.5 mg/dL (ref 8.4–10.5)
Chloride: 104 mEq/L (ref 96–112)
Chloride: 106 mEq/L (ref 96–112)
Creatinine, Ser: 0.9 mg/dL (ref 0.50–1.35)
Creatinine, Ser: 1.06 mg/dL (ref 0.50–1.35)
GFR calc Af Amer: 88 mL/min — ABNORMAL LOW (ref >90)
GFR calc non Af Amer: 76 mL/min — ABNORMAL LOW (ref >90)
Glucose, Bld: 88 mg/dL (ref 70–99)
Total Bilirubin: 2 mg/dL — ABNORMAL HIGH (ref 0.3–1.2)
Total Bilirubin: 1.5 mg/dL — ABNORMAL HIGH (ref 0.3–1.2)

## 2011-01-18 LAB — PROTIME-INR
INR: 0.98 (ref 0.00–1.49)
Prothrombin Time: 13.2 seconds (ref 11.6–15.2)

## 2011-01-18 LAB — DIFFERENTIAL
Basophils Absolute: 0 10*3/uL (ref 0.0–0.1)
Basophils Relative: 1 % (ref 0–1)
Lymphocytes Relative: 26 % (ref 12–46)
Monocytes Absolute: 0.5 10*3/uL (ref 0.1–1.0)
Neutro Abs: 3.2 10*3/uL (ref 1.7–7.7)
Neutrophils Relative %: 62 % (ref 43–77)

## 2011-01-18 LAB — CBC
HCT: 42 % (ref 39.0–52.0)
MCH: 29.6 pg (ref 26.0–34.0)
MCV: 86.2 fL (ref 78.0–100.0)
Platelets: 215 10*3/uL (ref 150–400)
RBC: 4.87 MIL/uL (ref 4.22–5.81)
WBC: 5.2 10*3/uL (ref 4.0–10.5)

## 2011-01-18 LAB — TYPE AND SCREEN
ABO/RH(D): A NEG
Antibody Screen: NEGATIVE

## 2011-01-18 LAB — PROTHROMBIN TIME
INR: 1.39 (ref <1.50)
Prothrombin Time: 17.6 seconds — ABNORMAL HIGH (ref 11.6–15.2)

## 2011-01-18 LAB — APTT: aPTT: 31 seconds (ref 24–37)

## 2011-01-18 SURGERY — LAPAROSCOPIC CHOLECYSTECTOMY WITH INTRAOPERATIVE CHOLANGIOGRAM
Anesthesia: General

## 2011-01-18 MED ORDER — ONDANSETRON HCL 4 MG/2ML IJ SOLN
INTRAMUSCULAR | Status: DC | PRN
  Administered 2011-01-18: 4 mg via INTRAVENOUS

## 2011-01-18 MED ORDER — HYDROCODONE-ACETAMINOPHEN 5-325 MG PO TABS: 1.0000 | ORAL_TABLET | ORAL | Status: AC | PRN

## 2011-01-18 MED ORDER — PROPOFOL 10 MG/ML IV EMUL
INTRAVENOUS | Status: DC | PRN
  Administered 2011-01-18: 120 mg via INTRAVENOUS

## 2011-01-18 MED ORDER — COAGULATION FACTOR IX (RECOMB) 250 UNITS IV SOLR: 7446.0000 [IU] | INTRAVENOUS | Status: DC

## 2011-01-18 MED ORDER — ROCURONIUM BROMIDE 100 MG/10ML IV SOLN
INTRAVENOUS | Status: DC | PRN
  Administered 2011-01-18: 40 mg via INTRAVENOUS

## 2011-01-18 MED ORDER — NEOSTIGMINE METHYLSULFATE 1 MG/ML IJ SOLN
INTRAMUSCULAR | Status: DC | PRN
  Administered 2011-01-18: 4 mg via INTRAVENOUS

## 2011-01-18 MED ORDER — LACTATED RINGERS IV SOLN
INTRAVENOUS | Status: DC
  Administered 2011-01-18: 1000 mL via INTRAVENOUS

## 2011-01-18 MED ORDER — PROMETHAZINE HCL 25 MG/ML IJ SOLN: 6.2500 mg | INTRAMUSCULAR | Status: DC | PRN

## 2011-01-18 MED ORDER — HYDROMORPHONE HCL PF 1 MG/ML IJ SOLN: 0.2500 mg | INTRAMUSCULAR | Status: DC | PRN

## 2011-01-18 MED ORDER — COAGULATION FACTOR IX (RECOMB) 250 UNITS IV SOLR
7350.0000 [IU] | Freq: Once | INTRAVENOUS | Status: AC
  Administered 2011-01-18: 7350 [IU] via INTRAVENOUS
  Filled 2011-01-18: qty 7350

## 2011-01-18 MED ORDER — FENTANYL CITRATE 0.05 MG/ML IJ SOLN
INTRAMUSCULAR | Status: DC | PRN
  Administered 2011-01-18: 100 ug via INTRAVENOUS
  Administered 2011-01-18 (×2): 50 ug via INTRAVENOUS
  Administered 2011-01-18 (×2): 100 ug via INTRAVENOUS

## 2011-01-18 MED ORDER — OLMESARTAN MEDOXOMIL 40 MG PO TABS
40.0000 mg | ORAL_TABLET | Freq: Every day | ORAL | Status: DC
  Administered 2011-01-18 – 2011-01-19 (×2): 40 mg via ORAL
  Filled 2011-01-18 (×2): qty 1

## 2011-01-18 MED ORDER — MIDAZOLAM HCL 5 MG/5ML IJ SOLN
INTRAMUSCULAR | Status: DC | PRN
  Administered 2011-01-18: 2 mg via INTRAVENOUS

## 2011-01-18 MED ORDER — GLYCOPYRROLATE 0.2 MG/ML IJ SOLN
INTRAMUSCULAR | Status: DC | PRN
  Administered 2011-01-18: .08 mg via INTRAVENOUS

## 2011-01-18 MED ORDER — HEMOSTATIC AGENTS (NO CHARGE) OPTIME
TOPICAL | Status: DC | PRN
  Administered 2011-01-18: 2 via TOPICAL

## 2011-01-18 MED ORDER — COAGULATION FACTOR IX (RECOMB) 250 UNITS IV SOLR
7664.0000 [IU] | Freq: Once | INTRAVENOUS | Status: DC
  Filled 2011-01-18: qty 7664

## 2011-01-18 MED ORDER — KCL IN DEXTROSE-NACL 20-5-0.9 MEQ/L-%-% IV SOLN
INTRAVENOUS | Status: DC
  Administered 2011-01-19: 06:00:00 via INTRAVENOUS
  Filled 2011-01-18 (×4): qty 1000

## 2011-01-18 MED ORDER — BUPIVACAINE-EPINEPHRINE 0.25% -1:200000 IJ SOLN
INTRAMUSCULAR | Status: DC | PRN
  Administered 2011-01-18: 13 mL

## 2011-01-18 MED ORDER — MORPHINE SULFATE 2 MG/ML IJ SOLN
2.0000 mg | INTRAMUSCULAR | Status: DC | PRN
  Administered 2011-01-18 (×3): 4 mg via INTRAVENOUS
  Filled 2011-01-18 (×3): qty 2

## 2011-01-18 MED ORDER — IOHEXOL 300 MG/ML  SOLN
INTRAMUSCULAR | Status: DC | PRN
  Administered 2011-01-18: 5 mL via INTRAVENOUS

## 2011-01-18 MED ORDER — SODIUM CHLORIDE 0.9 % IR SOLN
Status: DC | PRN
  Administered 2011-01-18: 1000 mL

## 2011-01-18 MED ORDER — ONDANSETRON HCL 4 MG/2ML IJ SOLN: 4.0000 mg | Freq: Four times a day (QID) | INTRAMUSCULAR | Status: DC | PRN

## 2011-01-18 MED ORDER — LACTATED RINGERS IV SOLN
INTRAVENOUS | Status: DC | PRN
  Administered 2011-01-18 (×2): via INTRAVENOUS

## 2011-01-18 MED ORDER — HYDROCODONE-ACETAMINOPHEN 5-325 MG PO TABS
1.0000 | ORAL_TABLET | ORAL | Status: DC | PRN
  Administered 2011-01-19: 1 via ORAL
  Administered 2011-01-19: 2 via ORAL
  Filled 2011-01-18 (×2): qty 2

## 2011-01-18 MED ORDER — FACTOR IX COMPLEX 500 UNITS IV SOLR
7350.0000 [IU] | Freq: Once | INTRAVENOUS | Status: DC
  Filled 2011-01-18 (×2): qty 7350

## 2011-01-18 MED ORDER — PAROXETINE HCL ER 12.5 MG PO TB24
12.5000 mg | ORAL_TABLET | ORAL | Status: DC
  Filled 2011-01-18: qty 1

## 2011-01-18 SURGICAL SUPPLY — 48 items
APPLIER CLIP 5 13 M/L LIGAMAX5 (MISCELLANEOUS) ×2
BENZOIN TINCTURE PRP APPL 2/3 (GAUZE/BANDAGES/DRESSINGS) ×2 IMPLANT
CANISTER SUCTION 2500CC (MISCELLANEOUS) ×2 IMPLANT
CHLORAPREP W/TINT 26ML (MISCELLANEOUS) ×2 IMPLANT
CLIP APPLIE 5 13 M/L LIGAMAX5 (MISCELLANEOUS) ×1 IMPLANT
CLOTH BEACON ORANGE TIMEOUT ST (SAFETY) ×2 IMPLANT
COVER MAYO STAND STRL (DRAPES) ×2 IMPLANT
COVER SURGICAL LIGHT HANDLE (MISCELLANEOUS) ×2 IMPLANT
DECANTER SPIKE VIAL GLASS SM (MISCELLANEOUS) ×4 IMPLANT
DRAPE C-ARM 42X72 X-RAY (DRAPES) ×2 IMPLANT
DRAPE UTILITY 15X26 W/TAPE STR (DRAPE) ×4 IMPLANT
DRSG TEGADERM 2-3/8X2-3/4 SM (GAUZE/BANDAGES/DRESSINGS) ×2 IMPLANT
ELECT REM PT RETURN 9FT ADLT (ELECTROSURGICAL) ×2
ELECTRODE REM PT RTRN 9FT ADLT (ELECTROSURGICAL) ×1 IMPLANT
GAUZE SPONGE 2X2 8PLY STRL LF (GAUZE/BANDAGES/DRESSINGS) ×1 IMPLANT
GLOVE BIO SURGEON STRL SZ7 (GLOVE) ×2 IMPLANT
GLOVE BIO SURGEON STRL SZ7.5 (GLOVE) ×4 IMPLANT
GLOVE BIOGEL PI IND STRL 7.0 (GLOVE) ×1 IMPLANT
GLOVE BIOGEL PI IND STRL 7.5 (GLOVE) ×2 IMPLANT
GLOVE BIOGEL PI IND STRL 8 (GLOVE) ×1 IMPLANT
GLOVE BIOGEL PI INDICATOR 7.0 (GLOVE) ×1
GLOVE BIOGEL PI INDICATOR 7.5 (GLOVE) ×2
GLOVE BIOGEL PI INDICATOR 8 (GLOVE) ×1
GLOVE ECLIPSE 8.0 STRL XLNG CF (GLOVE) ×4 IMPLANT
GLOVE SURG SIGNA 7.5 PF LTX (GLOVE) ×2 IMPLANT
GLOVE SURG SS PI 7.0 STRL IVOR (GLOVE) ×2 IMPLANT
GOWN STRL NON-REIN LRG LVL3 (GOWN DISPOSABLE) ×8 IMPLANT
HEMOSTAT SNOW SURGICEL 2X4 (HEMOSTASIS) ×4 IMPLANT
KIT BASIN OR (CUSTOM PROCEDURE TRAY) ×2 IMPLANT
KIT ROOM TURNOVER OR (KITS) ×2 IMPLANT
NS IRRIG 1000ML POUR BTL (IV SOLUTION) ×2 IMPLANT
PAD ARMBOARD 7.5X6 YLW CONV (MISCELLANEOUS) ×4 IMPLANT
POUCH SPECIMEN RETRIEVAL 10MM (ENDOMECHANICALS) ×2 IMPLANT
SCISSORS LAP 5X35 DISP (ENDOMECHANICALS) IMPLANT
SET CHOLANGIOGRAPH 5 50 .035 (SET/KITS/TRAYS/PACK) ×2 IMPLANT
SET IRRIG TUBING LAPAROSCOPIC (IRRIGATION / IRRIGATOR) ×2 IMPLANT
SLEEVE ENDOPATH XCEL 5M (ENDOMECHANICALS) ×4 IMPLANT
SPECIMEN JAR SMALL (MISCELLANEOUS) ×2 IMPLANT
SPONGE GAUZE 2X2 STER 10/PKG (GAUZE/BANDAGES/DRESSINGS) ×1
STRIP CLOSURE SKIN 1/2X4 (GAUZE/BANDAGES/DRESSINGS) ×2 IMPLANT
SUT MON AB 4-0 PC3 18 (SUTURE) ×2 IMPLANT
TOWEL OR 17X24 6PK STRL BLUE (TOWEL DISPOSABLE) ×2 IMPLANT
TOWEL OR 17X26 10 PK STRL BLUE (TOWEL DISPOSABLE) ×2 IMPLANT
TRAY LAPAROSCOPIC (CUSTOM PROCEDURE TRAY) ×2 IMPLANT
TROCAR XCEL BLUNT TIP 100MML (ENDOMECHANICALS) ×2 IMPLANT
TROCAR XCEL NON-BLD 11X100MML (ENDOMECHANICALS) IMPLANT
TROCAR XCEL NON-BLD 5MMX100MML (ENDOMECHANICALS) ×2 IMPLANT
WATER STERILE IRR 1000ML POUR (IV SOLUTION) IMPLANT

## 2011-01-18 NOTE — Progress Notes (Signed)
INPATIENT PROGRESS NOTE  David Chen [id number] [birth year] 61 y.o. 01/18/2011 12:58 PM     Principle Diagnosis: [name] with no significant past medical history who I saw for the first time for a history of bleeding and for an evaluation prior to a colonoscopy in 2006. 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 At this time he has Factor IX deficiency (21% total).     This is a 21 -year-old gentleman, as mentioned above.  I have worked him up for a bleeding disorder back on 2006 .  At that time his PT was normal with an INR of 0.9.  PTT was marginally abnormal at 38.  He had a normal bleeding time.  I also checked von Willebrand's disease which was normal.  The patient reported he had a colonoscopy and he did have some post- polypectomy oozing that required hospitalization but did not require any blood transfusion.  The patient, clinically, reports he is feeling well.  He is completely asymptomatic at this point.  He still reports some excessive bruisability and some bleeding when he uses a regular razor.  The patient has had dental work in the past and did require extra packing for bleeding.  He had normal platelets and normal platelet function.  Clinically, again, he is currently feeling well.  He did report RUQ pain and was diagnosed with gall stones and he is anticipating having gall bladder surgery in the near future. Patient report eating liquid and low fat diet to avoid post prandial pain. His most recent work up did reveal a low Factor IX level.   Patient received benefix on 1/10 and 1/11 and scheduled for 1/12  Patient under went cholecystectomy without complications. PTT normalized on 1/11.  Patient feels well post up.      Medications: I have reviewed the patient's current medications. Current facility-administered medications:ciprofloxacin (CIPRO) IVPB 400 mg, 400 mg, Intravenous, 120 min pre-op, Adolph Pollack, MD, 400 mg at 01/18/11 0745;  coagulation factor IX  (recomb) (BENEFIX) injection 7,350 Units, 7,350 Units, Intravenous, Once, Emeline Gins, PHARMD, 7,350 Units at 01/18/11 0757 coagulation factor IX (recomb) (BENEFIX) injection 7,664 Units, 7,664 Units, Intravenous, Once, Emeline Gins, PHARMD;  dextrose 5 % and 0.9 % NaCl with KCl 20 mEq/L infusion, , Intravenous, Continuous, Adolph Pollack, MD;  HYDROcodone-acetaminophen (NORCO) 5-325 MG per tablet 1-2 tablet, 1-2 tablet, Oral, Q4H PRN, Adolph Pollack, MD;  morphine 2 MG/ML injection 2-6 mg, 2-6 mg, Intravenous, Q2H PRN, Adolph Pollack, MD olmesartan (BENICAR) tablet 40 mg, 40 mg, Oral, Daily, Adolph Pollack, MD;  PARoxetine (PAXIL-CR) 24 hr tablet 12.5 mg, 12.5 mg, Oral, QODAY, Adolph Pollack, MD;  DISCONTD: bupivacaine-EPINEPHrine (MARCAINE W/ EPI) 0.25 % (with pres) injection, , , PRN, Adolph Pollack, MD, 13 mL at 01/18/11 0834;  DISCONTD: coagulation factor IX (recomb) (BENEFIX) injection 7,446 Units, 7,446 Units, Intravenous, Q24H, Eli Hose, MD DISCONTD: factor IX complex (KONYNE-80) injection 7,350 Units, 7,350 Units, Intravenous, Once, E. Jairo Ben, MD;  DISCONTD: hemostatic agents, , , PRN, Adolph Pollack, MD, 2 application at 01/18/11 [phone];  DISCONTD: HYDROmorphone (DILAUDID) injection 0.25-0.5 mg, 0.25-0.5 mg, Intravenous, Q5 min PRN, E. Jairo Ben, MD;  DISCONTD: iohexol (OMNIPAQUE) 300 MG/ML solution, , , PRN, Adolph Pollack, MD, 5 mL at 01/18/11 2725 DISCONTD: lactated ringers infusion, , Intravenous, Continuous, E. Jairo Ben, MD, Last Rate: 75 mL/hr at 01/18/11 0931, 1,000 mL at 01/18/11 0931;  DISCONTD: promethazine (PHENERGAN) injection  6.25-12.5 mg, 6.25-12.5 mg, Intravenous, Q15 min PRN, E. Jairo Ben, MD;  DISCONTD: sodium chloride irrigation 0.9 %, , , PRN, Adolph Pollack, MD, 1,000 mL at 01/18/11 1610 DISCONTD: sodium chloride irrigation 0.9 %, , , PRN, Adolph Pollack, MD, 1,000 mL at 01/18/11  9604 Facility-Administered Medications Ordered in Other Encounters: DISCONTD: fentaNYL (SUBLIMAZE) injection, , , PRN, Sande Brothers, 100 mcg at 01/18/11 0900;  DISCONTD: glycopyrrolate (ROBINUL) injection, , , PRN, Sande Brothers, 0.08 mg at 01/18/11 5409;  DISCONTD: lactated ringers infusion, , , Continuous PRN, Sande Brothers;  DISCONTD: midazolam (VERSED) 5 MG/5ML injection, , , PRN, Sande Brothers, 2 mg at 01/18/11 8119 DISCONTD: neostigmine (PROSTIGMINE) injection, , Intravenous, PRN, Sande Brothers, 4 mg at 01/18/11 1478;  DISCONTD: ondansetron (ZOFRAN) injection, , , PRN, Sande Brothers, 4 mg at 01/18/11 0830;  DISCONTD: propofol (DIPRIVAN) 10 MG/ML infusion, , , PRN, Sande Brothers, 120 mg at 01/18/11 0747;  DISCONTD: rocuronium (ZEMURON) injection, , , PRN, Sande Brothers, 40 mg at 01/18/11 0747  Allergies:  Allergies  Allergen Reactions  . Penicillins     REACTION: hives    Past Medical History, Surgical history, Social history, and Family History were reviewed and updated.  Review of Systems: Constitutional:  Negative for fever, chills, night sweats, anorexia, weight loss, pain. Cardiovascular: no chest pain or dyspnea on exertion Respiratory: no cough, shortness of breath, or wheezing Neurological: no TIA or stroke symptoms Dermatological: negative ENT: negative Skin:neagtive Gastrointestinal: no abdominal pain, change in bowel habits, or black or bloody stools Genito-Urinary: no dysuria, trouble voiding, or hematuria Hematological and Lymphatic: negative Breast: negative Musculoskeletal: negative Remaining ROS negative. Physical Exam: Blood pressure 129/82, pulse 60, temperature 97.9 F (36.6 C), temperature source Oral, resp. rate 20, height 5\' 7"  (1.702 m), weight 173 lb (78.472 kg), SpO2 99.00%. ECOG: 0 General appearance: alert Head: Normocephalic, without obvious abnormality, atraumatic Neck: no adenopathy,  no carotid bruit, no JVD, supple, symmetrical, trachea midline and thyroid not enlarged, symmetric, no tenderness/mass/nodules Lymph nodes: Cervical, supraclavicular, and axillary nodes normal. Heart:regular rate and rhythm, S1, S2 normal, no murmur, click, rub or gallop Lung:chest clear, no wheezing, rales, normal symmetric air entry, Heart exam - S1, S2 normal, no murmur, no gallop, rate regular Abdomin: soft, non-tender, without masses or organomegaly EXT:no erythema, induration, or nodules No bleeding noted on exam  Lab Results: Lab Results  Component Value Date   WBC 5.2 01/18/2011   HGB 14.4 01/18/2011   HCT 42.0 01/18/2011   MCV 86.2 01/18/2011   PLT 215 01/18/2011     Chemistry      Component Value Date/Time   NA 140 01/18/2011 0615   K 4.6 01/18/2011 0615   CL 106 01/18/2011 0615   CO2 26 01/18/2011 0615   BUN 19 01/18/2011 0615   CREATININE 1.06 01/18/2011 0615      Component Value Date/Time   CALCIUM 9.5 01/18/2011 0615   ALKPHOS 60 01/18/2011 0615   AST 20 01/18/2011 0615   ALT 23 01/18/2011 0615   BILITOT 1.5* 01/18/2011 0615         Impression and Plan:  This is a 30 -year-old gentleman with clinically evidence of a mild bleeding disorder. This is likely Due to low factor IX.  The plan is get his Factor levels up to 100% today. A total dose of 7446 IU was given on 1/10 and 1/11 and will be repeated on 1/12 as inpatient.  If no bleeding  is noted, then no more factor to be given as out patient at this time.   I Will check PTT on 1/12. If no bleeding noted, OK to discharge from hematology stand point after 1/12.       Eli Hose, MD 1/11/201312:58 PM

## 2011-01-18 NOTE — Op Note (Signed)
Preoperative diagnosis:  Symptomatic cholelithiasis   Postoperative diagnosis:  same  Procedure: Laparoscopic cholecystectomy with cholangiogram.  Surgeon: Avel Peace, M.D.  Asst.:  Abigail Miyamoto M.D.  Anesthesia: Gen.  Indication:   This is a 61 year old male with biliary colic-type right upper quadrant pain. He was evaluated with an ultrasound which demonstrated findings consistent with gallbladder sludge/stones and a mild elevation of his bilirubin. He now presents for elective laparoscopic cholecystectomy. He has a mild factor IX deficiency and is being given factor IX perioperatively.  Technique: The patient was brought to the operating room, placed supine on the operating table, and a general anesthetic was administered. The hair on the abdominal wall was clipped as was necessary. The abdominal wall was then sterilely prepped and draped. Local anesthetic (Marcaine) was infiltrated in the subumbilical region. A small subumbilical incision was made through the skin, subcutaneous tissue, fascia, and peritoneum entering the peritoneal cavity under direct vision. A pursestring suture of 0 Vicryl was placed around the edges of the fascia. A Hassan trocar was introduced into the peritoneal cavity and a pneumoperitoneum was created by insufflation of carbon dioxide gas. The laparoscope was introduced into the trocar and no underlying bleeding or organ injury was noted. The patient was then placed in the reverse Trendelenburg position with the right side tilted slightly up.  Three more trochars were then placed into the abdominal cavity under laparoscopic vision. One in the epigastric area, and 2 in the right upper quadrant area. The gallbladder was visualized and thin adhesions between the gallbladder infundibulum and duodenum were separated bluntly.  The fundus was grasped and retracted toward the right shoulder.  The infundibulum was mobilized with dissection close to the gallbladder and  retracted laterally. The cystic duct was identified and a window was created around it. The anterior branch of the cystic artery was also identified and a window was created around it. The artery was clipped and divided.  The critical view was achieved. A clip was placed at the neck of the gallbladder. A small incision was made in the cystic duct. A cholangiocatheter was introduced through the anterior abdominal wall and placed in the cystic duct. A intraoperative cholangiogram was then performed.  Under real-time fluoroscopy, dilute contrast was injected into the cystic duct.  The common hepatic duct, the right and left hepatic ducts, and the common duct were all visualized. Contrast drained into the duodenum without obvious evidence of any obstructing ductal lesion. The final report is pending the Radiologist's interpretation.  The cholangiocatheter was removed, the cystic duct was clipped 3 times on the biliary side, and then the cystic duct was divided sharply. No bile leak was noted from the cystic duct stump.  Then a posterior branch of the cystic artery was identified, clipped and divided. Following this the gallbladder was dissected free from the liver using electrocautery. The gallbladder was then placed in a retrieval bag and removed from the abdominal cavity through the subumbilical incision.  The gallbladder fossa was inspected, irrigated, and bleeding was controlled with electrocautery.  Surgicel was placed in the gallbladder fossa.  Inspection showed that hemostasis was adequate and there was no evidence of bile leak.  The irrigation fluid was evacuated as much as possible.  The subumbilical trocar was removed and the fascial defect was closed by tightening and tying down the pursestring suture under laparoscopic vision.  The remaining trochars were removed and the pneumoperitoneum was released. The skin incisions were closed with 4-0 Monocryl subcuticular stitches. Steri-Strips and sterile  dressings were applied.  The procedure was well-tolerated without any apparent complications. The patient was taken to the recovery room in satisfactory condition.

## 2011-01-18 NOTE — Preoperative (Signed)
Beta Blockers   Reason not to administer Beta Blockers:Not Applicable 

## 2011-01-18 NOTE — Transfer of Care (Signed)
Immediate Anesthesia Transfer of Care Note  Patient: David Chen  Procedure(s) Performed:  LAPAROSCOPIC CHOLECYSTECTOMY WITH INTRAOPERATIVE CHOLANGIOGRAM  Patient Location: PACU  Anesthesia Type: General  Level of Consciousness: awake and alert   Airway & Oxygen Therapy: Patient Spontanous Breathing and Patient connected to nasal cannula oxygen  Post-op Assessment: Report given to PACU RN and Post -op Vital signs reviewed and stable  Post vital signs: Reviewed and stable Filed Vitals:   01/18/11 0633  BP: 117/73  Pulse: 68  Temp: 36.8 C  Resp: 16    Complications: No apparent anesthesia complications

## 2011-01-18 NOTE — H&P (Signed)
David Chen is an 61 y.o. male.   Chief Complaint: Here for elective Lap. Chole. HPI:   61 year old male with biliary colic type pain and gallbladder sludge/stones on [place].  Some elevation of T. Bili as well.  He present for elective Lap. Chole.  He has a mild factor IX deficiency and has been given Factor IX.    Past Medical History  Diagnosis Date  . High cholesterol   . Asthma   . Clotting disorder     Managed by Dr. Clelia Croft will see him Thursday for a Transfusion, unsure type not been diagnosed  . Hypertension     does not see a cardiologist  . Sleep apnea     Sleep study done at [facility] sleep center [facility]  . Factor IX deficiency     Past Surgical History  Procedure Date  . Colonoscopy 2006, 2-3 years ago    had issues with bleeding    History reviewed. No pertinent family history. Social History:  reports that he has quit smoking. He has never used smokeless tobacco. He reports that he drinks about 3.6 ounces of alcohol per week. He reports that he does not use illicit drugs.  Allergies:  Allergies  Allergen Reactions  . Penicillins     REACTION: hives    Medications Prior to Admission  Medication Dose Route Frequency Provider Last Rate Last Dose  . ciprofloxacin (CIPRO) IVPB 400 mg  400 mg Intravenous 120 min pre-op Adolph Pollack, MD      . factor IX complex (KONYNE-80) injection 7,664 Units  7,664 Units Intravenous Q24H E. Jairo Ben, MD       Medications Prior to Admission  Medication Sig Dispense Refill  . acetaminophen (TYLENOL) 500 MG tablet Take 1,000 mg by mouth every 6 (six) hours as needed. For pain      . atorvastatin (LIPITOR) 40 MG tablet Take 40 mg by mouth every morning.        Marland Kitchen telmisartan (MICARDIS) 40 MG tablet Take 40 mg by mouth daily.        Marland Kitchen azithromycin (ZITHROMAX) 250 MG tablet Take 250 mg by mouth daily. Started 11/21/10       . famotidine (PEPCID) 20 MG tablet Take 1 tablet (20 mg total) by mouth 2 (two) times  daily.  60 tablet  0  . fish oil-omega-3 fatty acids 1000 MG capsule Take 2 g by mouth daily.        Marland Kitchen FLUTICASONE PROPIONATE  HFA IN Inhale 1 puff into the lungs daily.       Marland Kitchen HYDROcodone-acetaminophen (VICODIN) 5-500 MG per tablet Take 1 tablet by mouth every 6 (six) hours as needed. For pain      . ibuprofen (ADVIL,MOTRIN) 200 MG tablet Take 200 mg by mouth every 6 (six) hours as needed. For sinus pain      . magnesium citrate 1.745 GM/30ML SOLN Take 296 mLs by mouth once.        Marland Kitchen omeprazole (PRILOSEC) 20 MG capsule Take 1 capsule (20 mg total) by mouth daily.  30 capsule  0  . PARoxetine (PAXIL-CR) 12.5 MG 24 hr tablet Take 12.5 mg by mouth every other day.       . sodium chloride (OCEAN) 0.65 % nasal spray Place 1 spray into the nose as needed. For nasal drainage        Results for orders placed during the hospital encounter of 01/18/11 (from the past 48 hour(s))  APTT     Status: Normal   Collection Time   01/18/11  6:15 AM      Component Value Range Comment   aPTT 31  24 - 37 (seconds)   CBC     Status: Normal   Collection Time   01/18/11  6:15 AM      Component Value Range Comment   WBC 5.2  4.0 - 10.5 (K/uL)    RBC 4.87  4.22 - 5.81 (MIL/uL)    Hemoglobin 14.4  13.0 - 17.0 (g/dL)    HCT 11.9  14.7 - 82.9 (%)    MCV 86.2  78.0 - 100.0 (fL)    MCH 29.6  26.0 - 34.0 (pg)    MCHC 34.3  30.0 - 36.0 (g/dL)    RDW 56.2  13.0 - 86.5 (%)    Platelets 215  150 - 400 (K/uL)   COMPREHENSIVE METABOLIC PANEL     Status: Abnormal   Collection Time   01/18/11  6:15 AM      Component Value Range Comment   Sodium 140  135 - 145 (mEq/L)    Potassium 4.6  3.5 - 5.1 (mEq/L)    Chloride 106  96 - 112 (mEq/L)    CO2 26  19 - 32 (mEq/L)    Glucose, Bld 94  70 - 99 (mg/dL)    BUN 19  6 - 23 (mg/dL)    Creatinine, Ser 7.84  0.50 - 1.35 (mg/dL)    Calcium 9.5  8.4 - 10.5 (mg/dL)    Total Protein 6.4  6.0 - 8.3 (g/dL)    Albumin 3.7  3.5 - 5.2 (g/dL)    AST 20  0 - 37 (U/L)    ALT 23  0 - 53  (U/L)    Alkaline Phosphatase 60  39 - 117 (U/L)    Total Bilirubin 1.5 (*) 0.3 - 1.2 (mg/dL)    GFR calc non Af Amer 76 (*) >90 (mL/min)    GFR calc Af Amer 88 (*) >90 (mL/min)   DIFFERENTIAL     Status: Normal   Collection Time   01/18/11  6:15 AM      Component Value Range Comment   Neutrophils Relative 62  43 - 77 (%)    Neutro Abs 3.2  1.7 - 7.7 (K/uL)    Lymphocytes Relative 26  12 - 46 (%)    Lymphs Abs 1.4  0.7 - 4.0 (K/uL)    Monocytes Relative 10  3 - 12 (%)    Monocytes Absolute 0.5  0.1 - 1.0 (K/uL)    Eosinophils Relative 2  0 - 5 (%)    Eosinophils Absolute 0.1  0.0 - 0.7 (K/uL)    Basophils Relative 1  0 - 1 (%)    Basophils Absolute 0.0  0.0 - 0.1 (K/uL)   PROTIME-INR     Status: Normal   Collection Time   01/18/11  6:15 AM      Component Value Range Comment   Prothrombin Time 13.2  11.6 - 15.2 (seconds)    INR 0.98  0.00 - 1.49    TYPE AND SCREEN     Status: Normal   Collection Time   01/18/11  6:15 AM      Component Value Range Comment   ABO/RH(D) A NEG      Antibody Screen NEG      Sample Expiration 01/21/2011      No results found.  Review of Systems  Constitutional:  Negative for fever and chills.  HENT: Negative for congestion and sore throat.   Respiratory: Negative for cough.   Gastrointestinal: Negative for abdominal pain.    Blood pressure 117/73, pulse 68, temperature 98.3 F (36.8 C), temperature source Oral, resp. rate 16, SpO2 100.00%. Physical Exam  Constitutional: He appears well-developed and well-nourished. No distress.  HENT:  Head: Normocephalic and atraumatic.  Neck: Neck supple.  Cardiovascular: Normal rate and regular rhythm.   Respiratory: Effort normal and breath sounds normal.  GI: Soft. He exhibits no distension. There is no tenderness.  Musculoskeletal: He exhibits no edema.  Lymphadenopathy:    He has no cervical adenopathy.  Skin: Skin is warm and dry.     Assessment/Plan Right upper quadrant pain (biliary colic)  with gallbladder sludge/stones.  Has a mild Factor IX deficiency and he received Factor IX yesterday and will be receiving it today.  Plan:  Laparoscopic cholecystectomy with IOC.  Procedure, risks, and aftercare have been explained to him.  Joseline Mccampbell J 01/18/2011, 7:33 AM

## 2011-01-18 NOTE — Anesthesia Postprocedure Evaluation (Signed)
  Anesthesia Post-op Note  Patient: [name]  Procedure(s) Performed:  LAPAROSCOPIC CHOLECYSTECTOMY WITH INTRAOPERATIVE CHOLANGIOGRAM  Patient Location: PACU  Anesthesia Type: General  Level of Consciousness: awake, alert  and oriented  Airway and Oxygen Therapy: Patient Spontanous Breathing  Post-op Pain: mild  Post-op Assessment: Post-op Vital signs reviewed, Patient's Cardiovascular Status Stable, Respiratory Function Stable, Patent Airway, No signs of Nausea or vomiting and Pain level controlled  Post-op Vital Signs: Reviewed and stable  Complications: No apparent anesthesia complications

## 2011-01-18 NOTE — Interval H&P Note (Signed)
History and Physical Interval Note:  01/18/2011 7:37 AM  David Chen  has presented today for surgery, with the diagnosis of Symptomatic cholelithiasis.  The various methods of treatment have been discussed with the patient and family. After consideration of risks, benefits and other options for treatment, the patient has consented to  Procedure(s): LAPAROSCOPIC CHOLECYSTECTOMY WITH INTRAOPERATIVE CHOLANGIOGRAM as a surgical intervention .  The patients' history has been reviewed, patient examined, no change in status, stable for surgery.  I have reviewed the patients' chart and labs.  Questions were answered to the patient's satisfaction.     Gloristine Turrubiates Shela Commons

## 2011-01-19 LAB — APTT: aPTT: 29 seconds (ref 24–37)

## 2011-01-19 MED ORDER — COAGULATION FACTOR IX (RECOMB) 250 UNITS IV SOLR
7350.0000 [IU] | Freq: Once | INTRAVENOUS | Status: AC
  Administered 2011-01-19: 7350 [IU] via INTRAVENOUS
  Filled 2011-01-19: qty 7350

## 2011-01-19 NOTE — Progress Notes (Signed)
Pt discharged to home accomp by wife.  Reviewed all discharge instructions with pt and wife.  Reviewed to call [facility] if any problems, questions or concerns or any signs of bleeding.  Rx for Vicodin given to pt and explained.  Pt understands all instructions and will call [facility] for FU appt with Dr. Abbey Chatters.  Abd sites are covered with 2x2 and transparent dressing at present with old bloody drainage noted on only the right lateral aspect incisional wound.  Otherwise, the sites are clean, dry and intact.

## 2011-01-21 ENCOUNTER — Encounter (HOSPITAL_COMMUNITY): Payer: Self-pay | Admitting: General Surgery

## 2011-02-06 ENCOUNTER — Telehealth (INDEPENDENT_AMBULATORY_CARE_PROVIDER_SITE_OTHER): Payer: Self-pay

## 2011-02-06 NOTE — Telephone Encounter (Signed)
LM on VM for pt to return call for post op appt.

## 2011-02-08 ENCOUNTER — Encounter (INDEPENDENT_AMBULATORY_CARE_PROVIDER_SITE_OTHER): Payer: Self-pay | Admitting: General Surgery

## 2011-02-11 ENCOUNTER — Encounter (INDEPENDENT_AMBULATORY_CARE_PROVIDER_SITE_OTHER): Payer: Self-pay | Admitting: General Surgery

## 2011-02-12 ENCOUNTER — Ambulatory Visit (INDEPENDENT_AMBULATORY_CARE_PROVIDER_SITE_OTHER): Payer: Commercial Managed Care - PPO | Admitting: General Surgery

## 2011-02-12 ENCOUNTER — Encounter (INDEPENDENT_AMBULATORY_CARE_PROVIDER_SITE_OTHER): Payer: Self-pay | Admitting: General Surgery

## 2011-02-12 VITALS — BP 150/78 | HR 68 | Temp 97.5°F | Resp 16 | Ht 67.0 in | Wt 171.6 lb

## 2011-02-12 DIAGNOSIS — Z9889 Other specified postprocedural states: Secondary | ICD-10-CM

## 2011-02-12 DIAGNOSIS — D67 Hereditary factor IX deficiency: Secondary | ICD-10-CM | POA: Insufficient documentation

## 2011-02-12 NOTE — Patient Instructions (Signed)
Low fat diet. Activities as tolerated. 

## 2011-02-12 NOTE — Progress Notes (Signed)
He/ is here for a postop visit following laparoscopic cholecystectomy.  Diet is being tolerated, bowels are moving. He has noticed that if he eats a large meal he has to go and have a bowel movement soon after the meal.   No problems with incisions.  PE:  ABD:  Soft, incisions clean/dry/intact and solid.  Assessment:  Doing well postop.  No bleeding problems from his factor IX deficiency.  Plan:  Lowfat diet recommended.  Activities as tolerated.  Return visit prn.

## 2012-05-21 IMAGING — US US ABDOMEN COMPLETE
1 series · 14 of 25 positions shown · non-contrast
Comparison: None.

CLINICAL DATA: Abdominal pain

ABDOMINAL ULTRASOUND COMPLETE

[Series 1: us abdomen complete · 0.30mm/px · 14 of 50 slices shown]
[im 1/50]
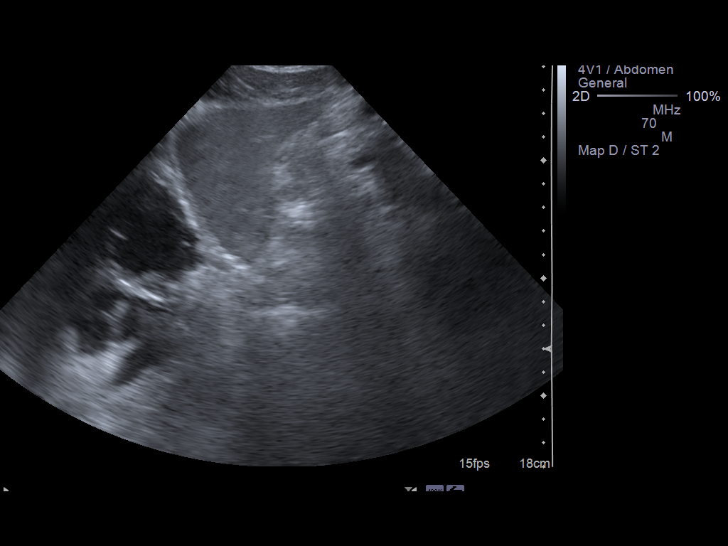
[im 5/50]
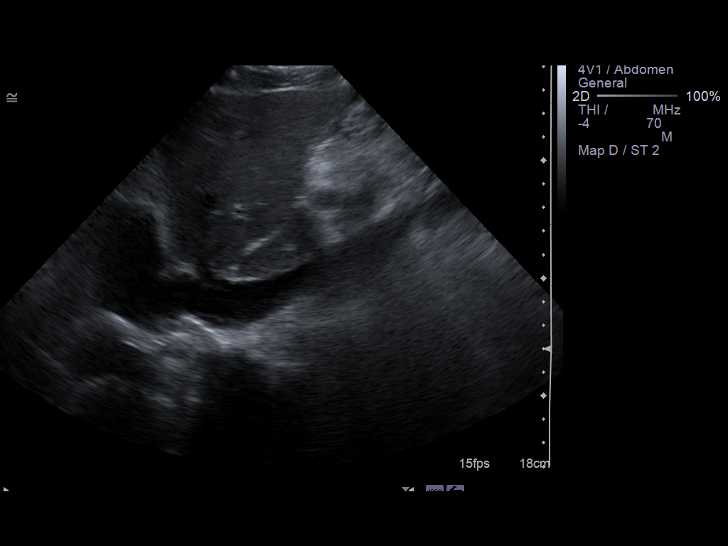
[im 9/50]
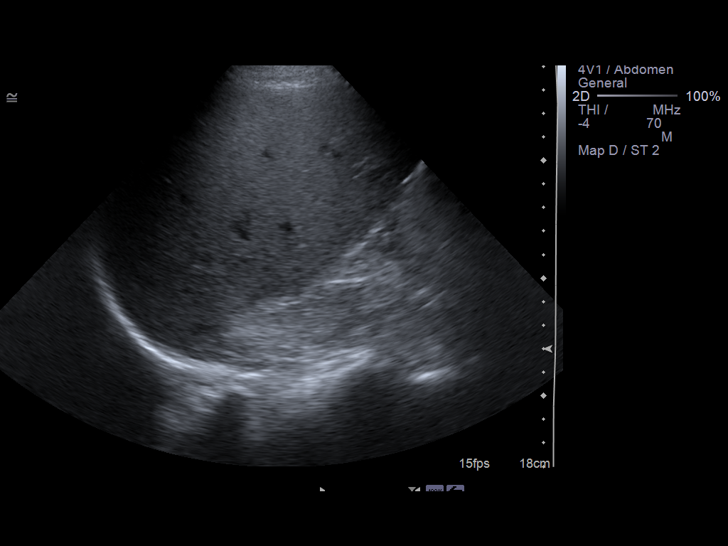
[im 13/50]
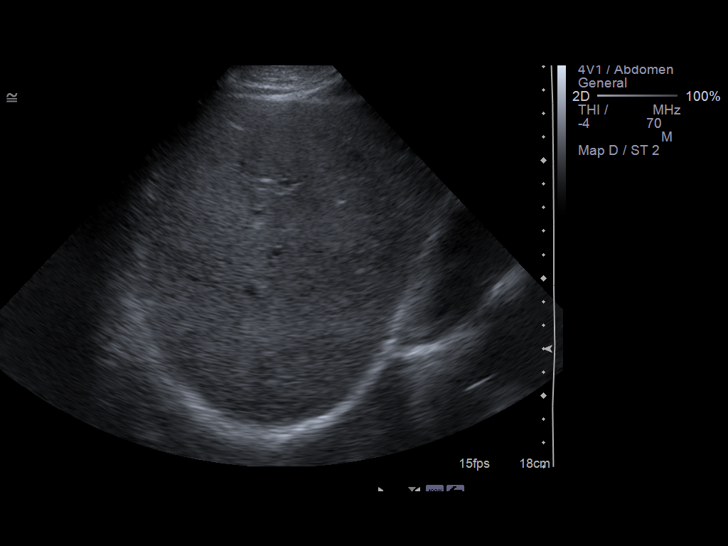
[im 17/50]
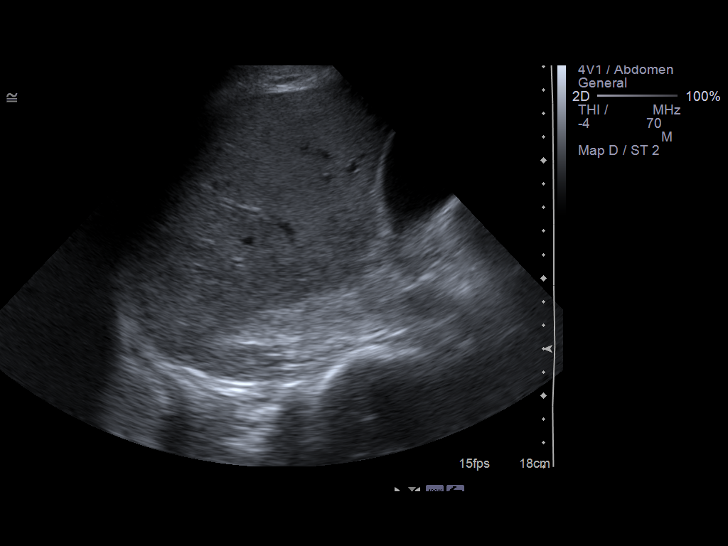
[im 19/50]
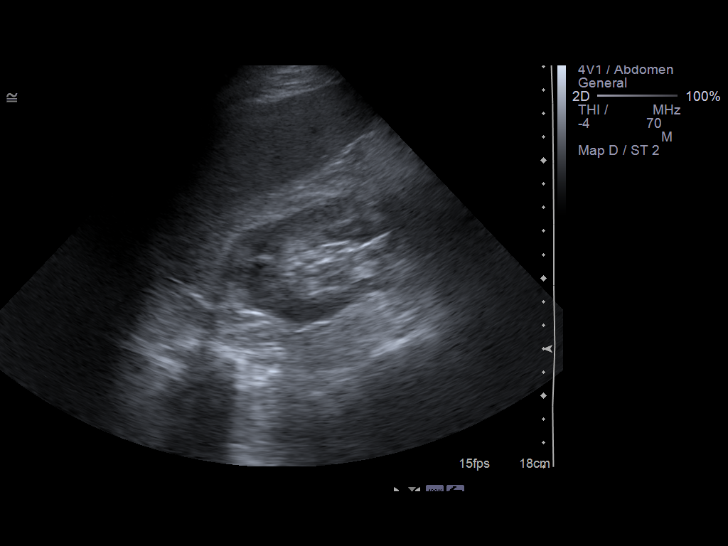
[im 23/50]
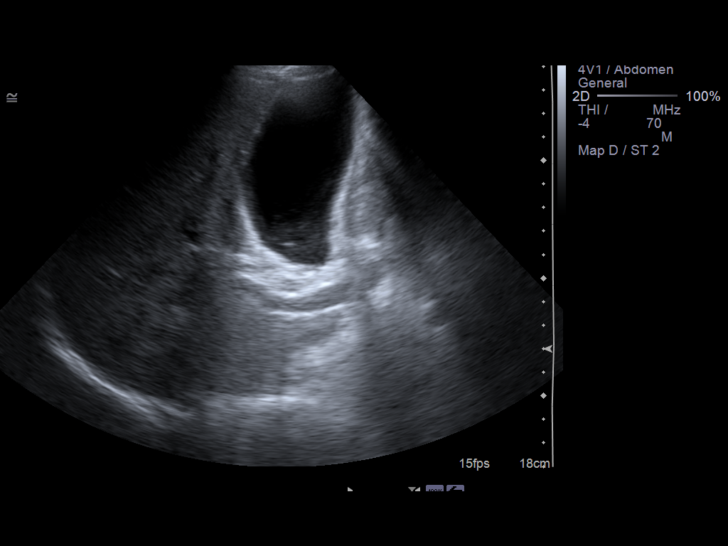
[im 27/50]
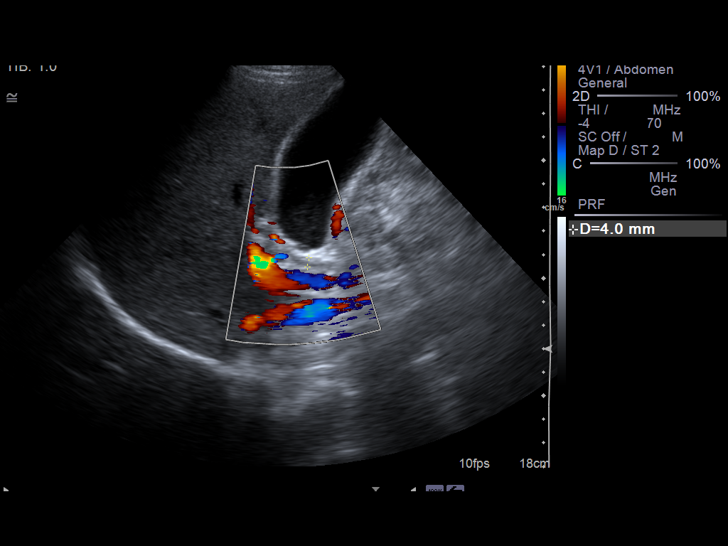
[im 31/50]
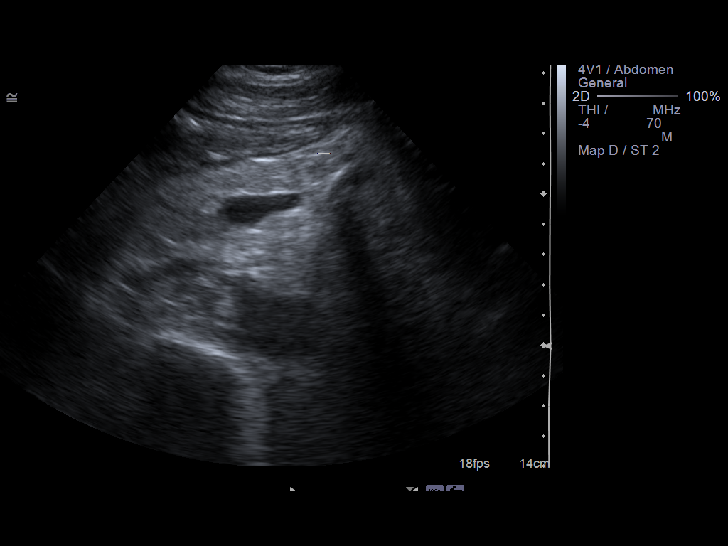
[im 33/50]
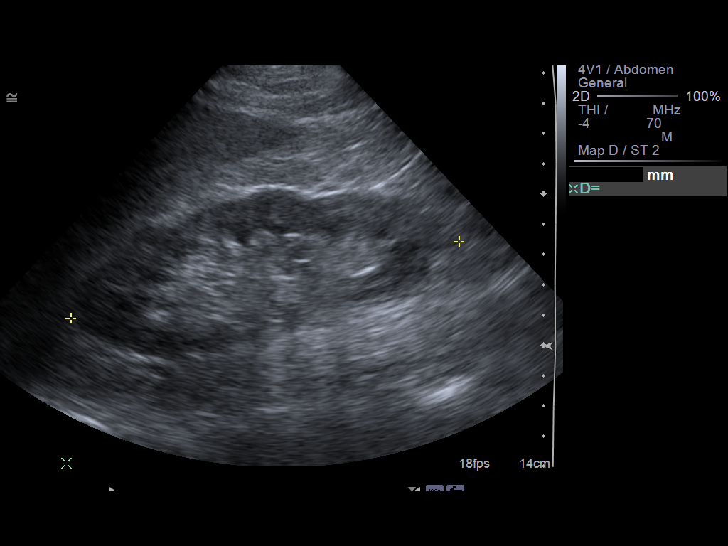
[im 37/50]
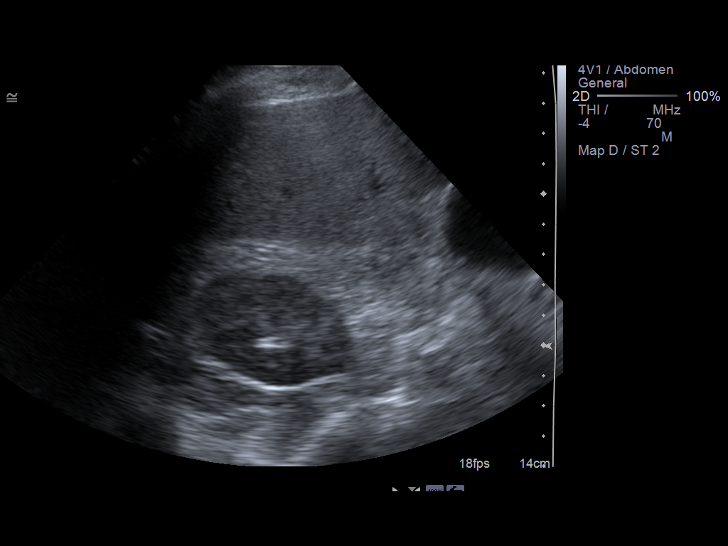
[im 41/50]
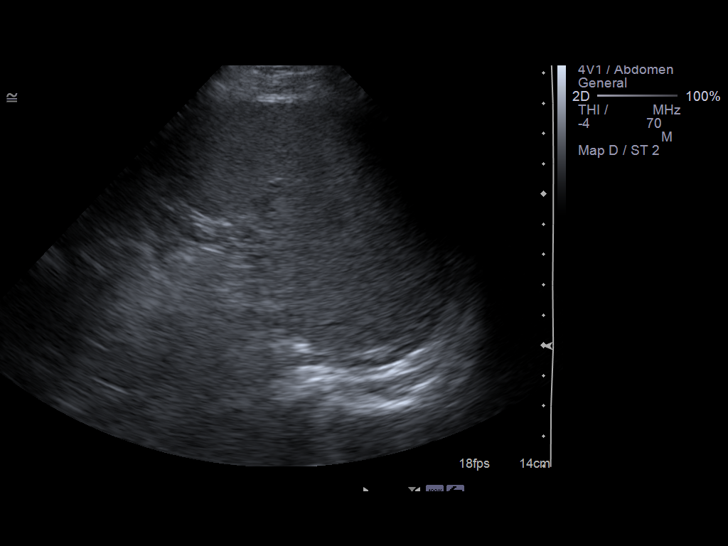
[im 45/50]
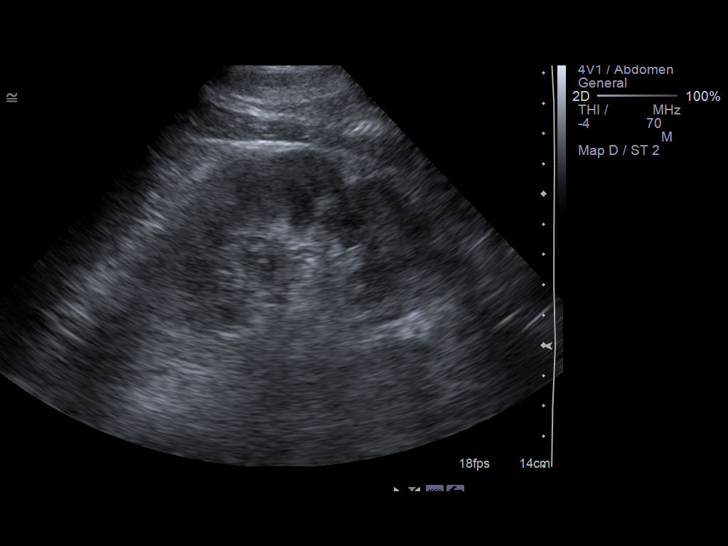
[im 50/50]
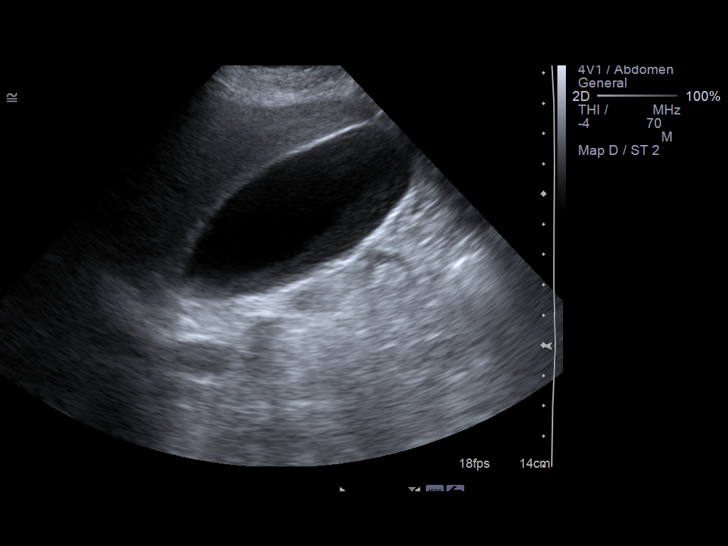

[14 of 25 positions shown; findings below may reference images not displayed]

FINDINGS: Gallbladder:  Gallbladder sludge is present.  Hyperechoic foci are
present within the sludge likely representing cholesterol crystals.
No shadowing stones are identified.

Common Bile Duct:  Within normal limits in caliber.

Liver: No focal mass lesion identified.  Within normal limits in
parenchymal echogenicity.

IVC:  Appears normal.

Pancreas:  No abnormality identified.

Spleen:  Within normal limits in size and echotexture.

Right kidney:  13.1 cm in length.  No hydronephrosis or mass.
Scarring in the lower pole is noted.

Left kidney:  Normal in size and parenchymal echogenicity.  No
evidence of mass or hydronephrosis.

Abdominal Aorta:  No aneurysm identified.
IMPRESSION: Gallbladder sludge containing hyperechoic foci most consistent with
cholesterol crystals.  No definite shadowing gallstones.

## 2012-07-24 IMAGING — CR DG CHEST 2V
2 series · 2 of 2 positions shown · non-contrast
Comparison: None.

CLINICAL DATA: Preop.

CHEST - 2 VIEW

[view not recorded (1 of 2)]
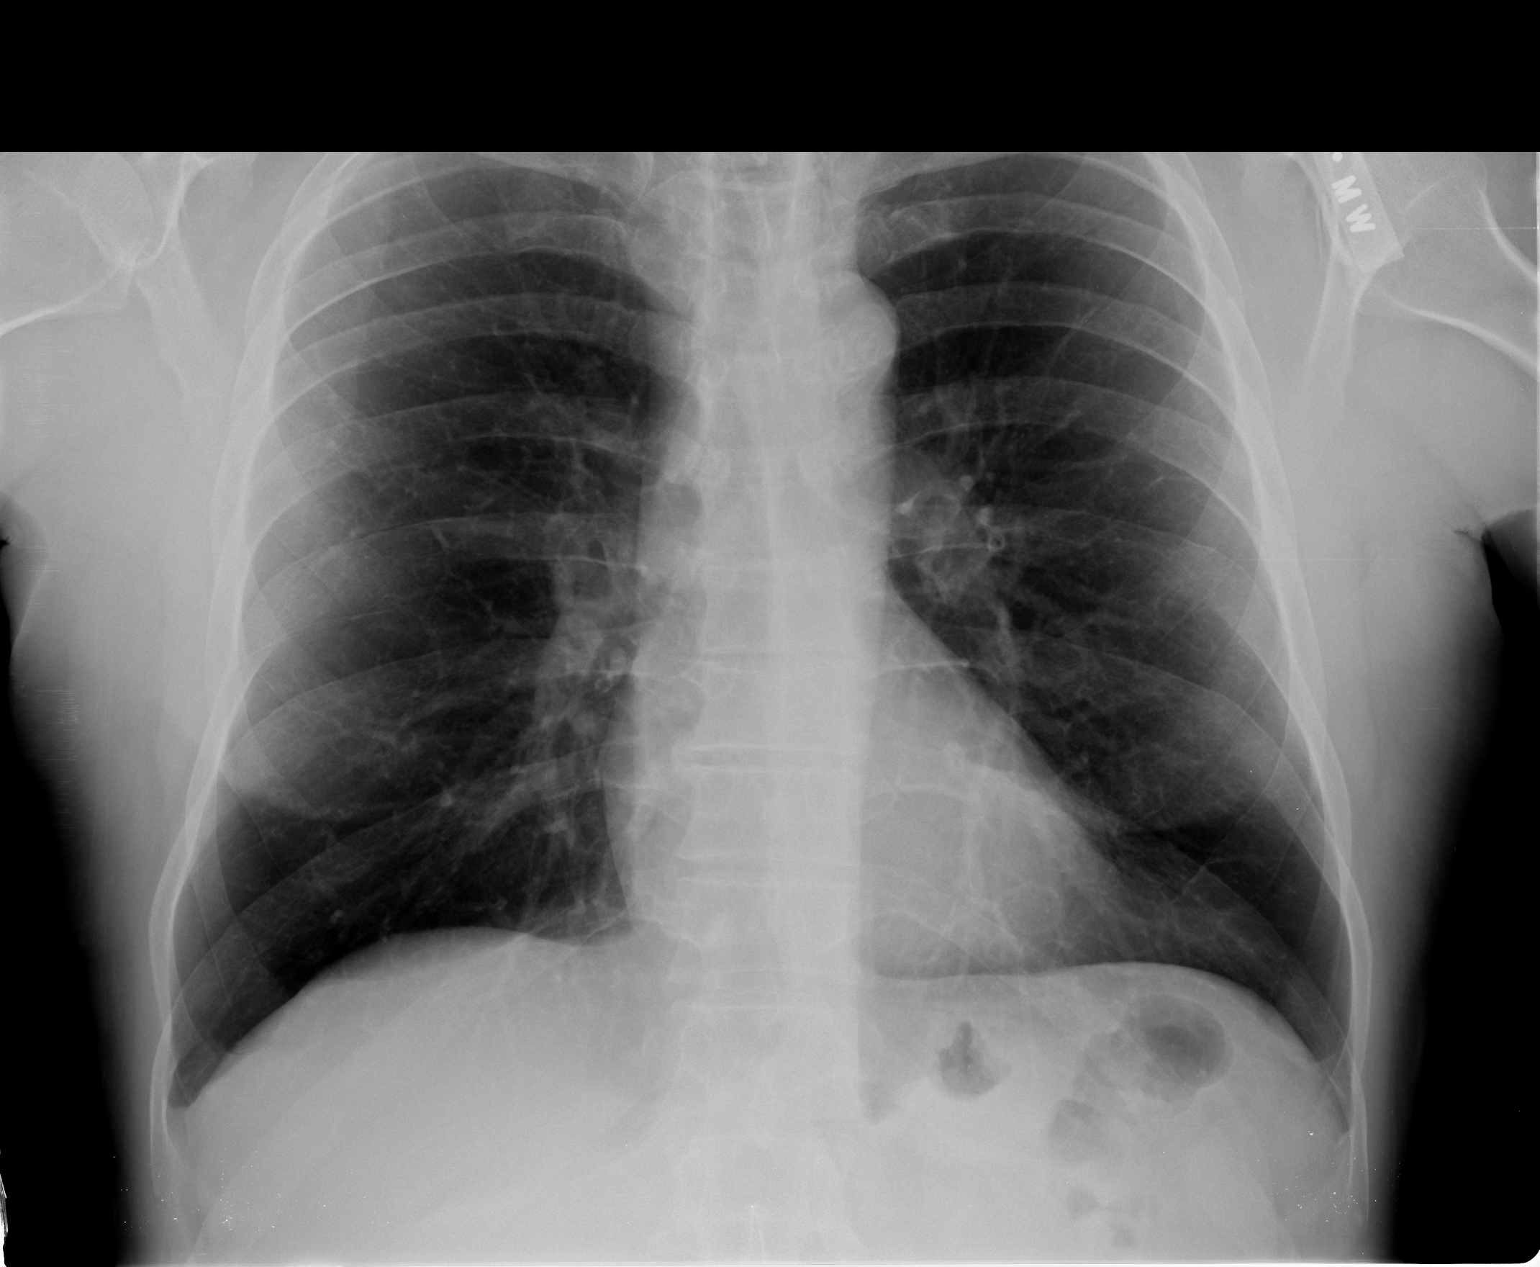

[view not recorded (2 of 2)]
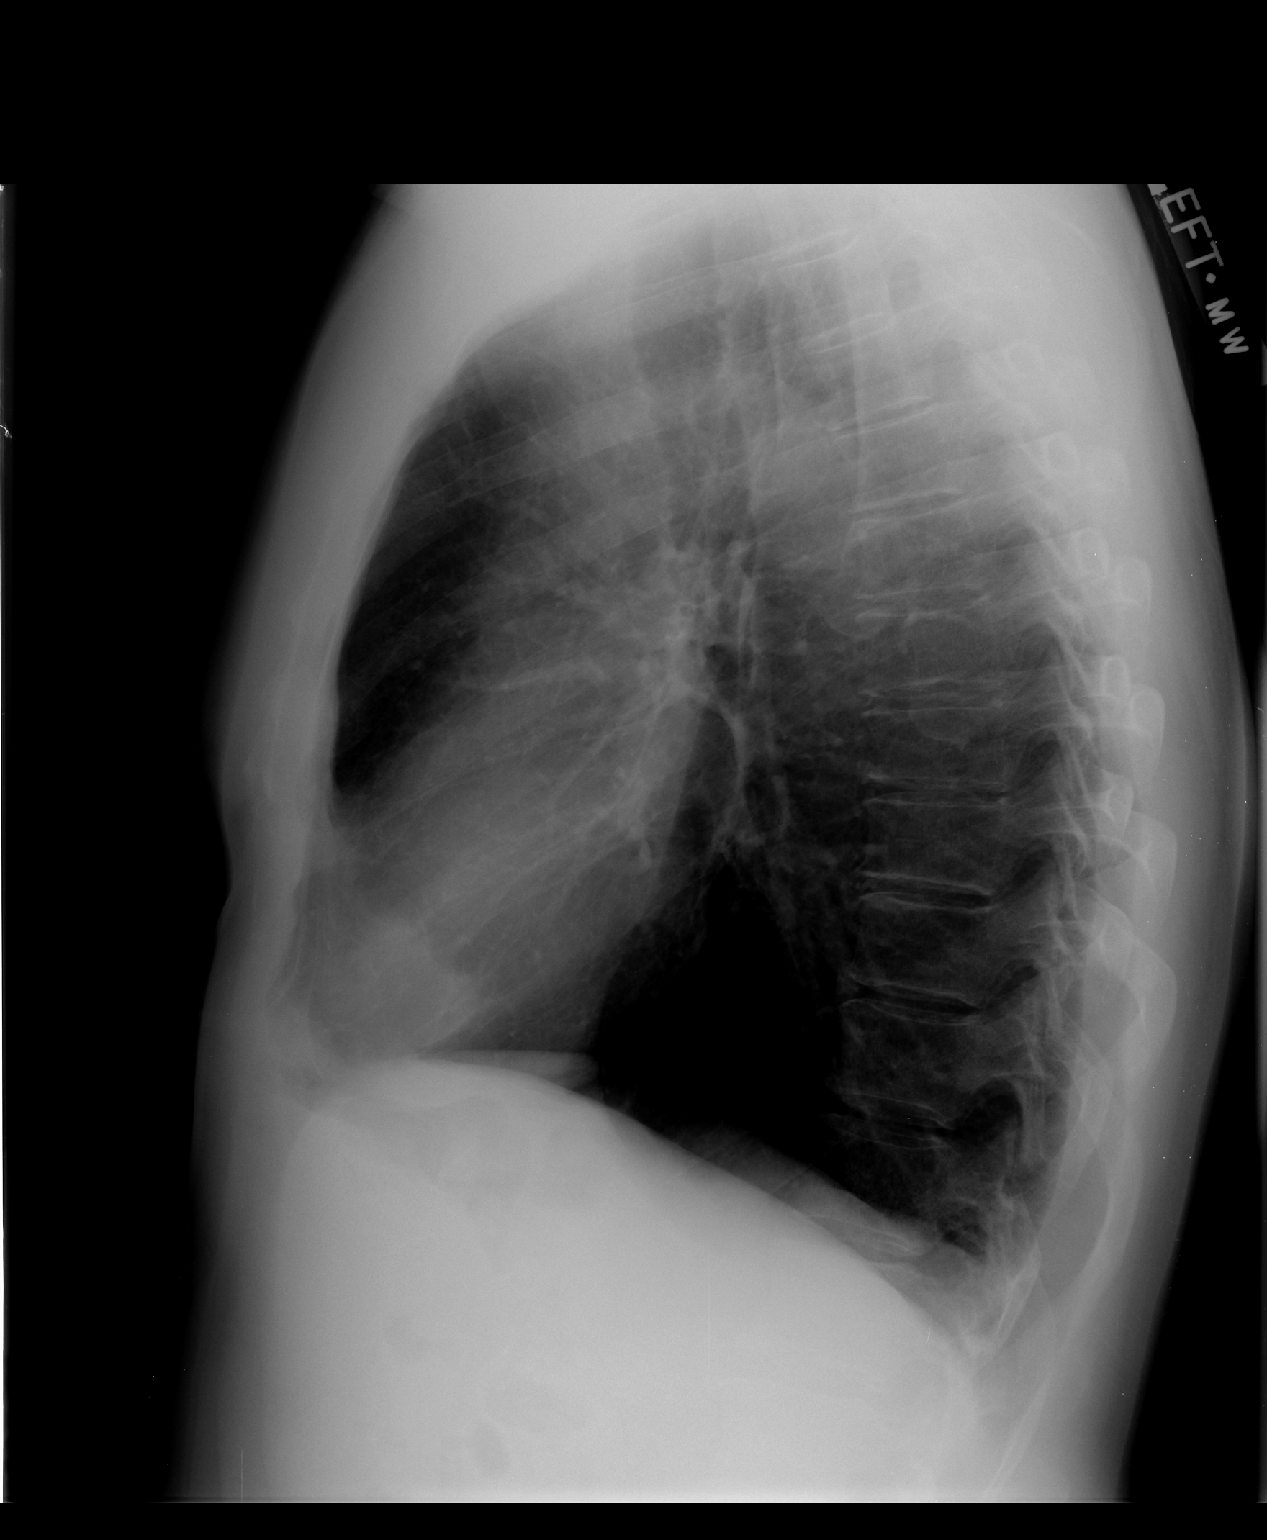

[2 of 2 positions shown; findings below may reference images not displayed]

FINDINGS: Trachea is midline.  Heart size normal.  Lungs are
hyperinflated but clear.  No pleural fluid.
IMPRESSION: COPD without acute finding.

## 2012-09-17 ENCOUNTER — Other Ambulatory Visit: Payer: Self-pay | Admitting: Dermatology

## 2012-12-21 ENCOUNTER — Encounter: Payer: Self-pay | Admitting: Gastroenterology

## 2013-05-17 ENCOUNTER — Encounter: Payer: Self-pay | Admitting: Gastroenterology

## 2013-07-12 ENCOUNTER — Ambulatory Visit: Payer: 59 | Admitting: Gastroenterology

## 2013-07-12 ENCOUNTER — Ambulatory Visit (INDEPENDENT_AMBULATORY_CARE_PROVIDER_SITE_OTHER): Payer: BC Managed Care – PPO | Admitting: Gastroenterology

## 2013-07-12 ENCOUNTER — Encounter: Payer: Self-pay | Admitting: Gastroenterology

## 2013-07-12 ENCOUNTER — Telehealth: Payer: Self-pay | Admitting: *Deleted

## 2013-07-12 VITALS — BP 130/80 | HR 80 | Ht 67.0 in | Wt 190.6 lb

## 2013-07-12 DIAGNOSIS — Z860100 Personal history of colon polyps, unspecified: Secondary | ICD-10-CM

## 2013-07-12 DIAGNOSIS — D67 Hereditary factor IX deficiency: Secondary | ICD-10-CM

## 2013-07-12 HISTORY — DX: Personal history of colon polyps, unspecified: Z86.0100

## 2013-07-12 MED ORDER — NA SULFATE-K SULFATE-MG SULF 17.5-3.13-1.6 GM/177ML PO SOLN: 1.0000 | Freq: Once | ORAL | Status: DC

## 2013-07-12 NOTE — Addendum Note (Signed)
Addended by: Oda Kilts on: 07/12/2013 09:11 AM   Modules accepted: Orders

## 2013-07-12 NOTE — Assessment & Plan Note (Signed)
Plan followup colonoscopy 

## 2013-07-12 NOTE — Assessment & Plan Note (Signed)
Patient stated that he did not wish to have a preprocedure transfusion because of the excessive cost (15,000 dollars).  I will discuss this with his hematologist.  I discussed with the patient the higher risk for post-polypectomy bleeding.  If a polyp is removed greater than 2-3 mm I could place a clip prophylactically although this does not in sure that he will not bleed.  Patient understands the risk.

## 2013-07-12 NOTE — Telephone Encounter (Signed)
PER LETTER SENT TO DR SHADAD BY DR Deatra Ina, OK TO CONTINUE COLON WITHOUT TAKING A FACTOR 9 INFUSION  L/M FOR PT

## 2013-07-12 NOTE — Progress Notes (Signed)
_                                                                                                                History of Present Illness: This 63 year old white male with history of colon polyps, factor IX deficiency, here to schedule followup colonoscopy.  Last exam was 2010 where an adenomatous polyp was removed.  In 2006 he underwent polypectomy complicated by post procedure bleeding requiring clip placement.  2010 colonoscopy was uncomplicated.  Patient has no GI complaints including change of bowel habits, abdominal pain, or rectal bleeding.    Past Medical History  Diagnosis Date  . High cholesterol   . Asthma   . Clotting disorder     Managed by Dr. Alen Blew will see him Thursday for a Transfusion, unsure type not been diagnosed  . Hypertension     does not see a cardiologist  . Sleep apnea     Sleep study done at [facility] in [facility]  . Factor IX deficiency    Past Surgical History  Procedure Laterality Date  . Colonoscopy  2006, 2-3 years ago    had issues with bleeding  . Cholecystectomy  01/18/2011    Procedure: LAPAROSCOPIC CHOLECYSTECTOMY WITH INTRAOPERATIVE CHOLANGIOGRAM;  Surgeon: Odis Hollingshead, MD;  Location: [facility];  Service: General;  Laterality: N/A;   family history includes Bladder Cancer in his mother; Breast cancer in his mother; Lung cancer in his mother. There is no history of Colon cancer, Pancreatic cancer, Stomach cancer, Esophageal cancer, Diabetes, or Heart disease. Current Outpatient Prescriptions  Medication Sig Dispense Refill  . acetaminophen (TYLENOL) 500 MG tablet Take 1,000 mg by mouth every 6 (six) hours as needed. For pain      . atorvastatin (LIPITOR) 40 MG tablet Take 40 mg by mouth every morning.        . fish oil-omega-3 fatty acids 1000 MG capsule Take 2 g by mouth daily.        Marland Kitchen ibuprofen (ADVIL,MOTRIN) 200 MG tablet Take 200 mg by mouth as needed. For sinus pain      . PARoxetine (PAXIL-CR) 12.5 MG  24 hr tablet Take 12.5 mg by mouth every other day.      . sodium chloride (OCEAN) 0.65 % nasal spray Place 1 spray into the nose as needed. For nasal drainage      . VALSARTAN-HYDROCHLOROTHIAZIDE PO Take 1 tablet by mouth daily.       No current facility-administered medications for this visit.   Allergies as of 07/12/2013 - Review Complete 07/12/2013  Allergen Reaction Noted  . Penicillins Hives 12/21/2007    reports that he has quit smoking. He has never used smokeless tobacco. He reports that he drinks about 3.6 ounces of alcohol per week. He reports that he does not use illicit drugs.     Review of Systems: Pertinent positive and negative review of systems were noted in the above HPI section. All other review of systems were otherwise negative.  Vital signs  were reviewed in today's medical record Physical Exam: General: Well developed , well nourished, no acute distress Skin: anicteric Head: Normocephalic and atraumatic Eyes:  sclerae anicteric, EOMI Ears: Normal auditory acuity Mouth: No deformity or lesions Neck: Supple, no masses or thyromegaly Lungs: Clear throughout to auscultation Heart: Regular rate and rhythm; no murmurs, rubs or bruits Abdomen: Soft, non tender and non distended. No masses, hepatosplenomegaly or hernias noted. Normal Bowel sounds Rectal:deferred Musculoskeletal: Symmetrical with no gross deformities  Skin: No lesions on visible extremities Pulses:  Normal pulses noted Extremities: No clubbing, cyanosis, edema or deformities noted Neurological: Alert oriented x 4, grossly nonfocal Cervical Nodes:  No significant cervical adenopathy Inguinal Nodes: No significant inguinal adenopathy Psychological:  Alert and cooperative. Normal mood and affect  See Assessment and Plan under Problem List

## 2013-07-12 NOTE — Patient Instructions (Signed)
You have been scheduled for a colonoscopy. Please follow written instructions given to you at your visit today.  Please pick up your prep kit at the pharmacy within the next 1-3 days. If you use inhalers (even only as needed), please bring them with you on the day of your procedure. Your physician has requested that you go to www.startemmi.com and enter the access code given to you at your visit today. This web site gives a general overview about your procedure. However, you should still follow specific instructions given to you by our office regarding your preparation for the procedure.  We will contact Dr Alen Blew about NOT taking a Factor 9 infusion before your procedure We want to be sure this can be done without the infusion

## 2013-09-15 ENCOUNTER — Telehealth: Payer: Self-pay | Admitting: Gastroenterology

## 2013-09-15 NOTE — Telephone Encounter (Signed)
Patient lost instructions from office visit  Went over all instructions again for his Suprep liquid  Will call back if he has any more questions

## 2013-09-17 ENCOUNTER — Encounter: Payer: Self-pay | Admitting: Gastroenterology

## 2013-09-17 ENCOUNTER — Ambulatory Visit (AMBULATORY_SURGERY_CENTER): Payer: BC Managed Care – PPO | Admitting: Gastroenterology

## 2013-09-17 VITALS — BP 135/85 | HR 61 | Temp 97.7°F | Resp 14 | Ht 67.0 in | Wt 190.0 lb

## 2013-09-17 DIAGNOSIS — D126 Benign neoplasm of colon, unspecified: Secondary | ICD-10-CM

## 2013-09-17 DIAGNOSIS — Z86010 Personal history of colonic polyps: Secondary | ICD-10-CM

## 2013-09-17 DIAGNOSIS — K5730 Diverticulosis of large intestine without perforation or abscess without bleeding: Secondary | ICD-10-CM

## 2013-09-17 DIAGNOSIS — K648 Other hemorrhoids: Secondary | ICD-10-CM

## 2013-09-17 MED ORDER — SODIUM CHLORIDE 0.9 % IV SOLN: 500.0000 mL | INTRAVENOUS | Status: DC

## 2013-09-17 NOTE — Progress Notes (Signed)
Procedure ends, to recovery, report given and VSS. 

## 2013-09-17 NOTE — Op Note (Signed)
[facility]  [address] [place], [id number]   COLONOSCOPY PROCEDURE REPORT  PATIENT: David Chen, David Chen  MR#: [id number] BIRTHDATE: November 15, 1950 , 63  yrs. old GENDER: Male ENDOSCOPIST: Inda Castle, MD REFERRED PT:WSFK Lysbeth Galas, M.D. PROCEDURE DATE:  09/17/2013 PROCEDURE:   Colonoscopy with snare polypectomy First Screening Colonoscopy - Avg.  risk and is 50 yrs.  old or older - No.  Prior Negative Screening - Now for repeat screening. N/A  History of Adenoma - Now for follow-up colonoscopy & has been > or = to 3 yrs.  Yes hx of adenoma.  Has been 3 or more years since last colonoscopy.  Polyps Removed Today? Yes. ASA CLASS:   Class II INDICATIONS:Patient's personal history of colon polyps. 2010 MEDICATIONS: MAC sedation, administered by CRNA and Propofol (Diprivan) 160 mg IV  DESCRIPTION OF PROCEDURE:   After the risks benefits and alternatives of the procedure were thoroughly explained, informed consent was obtained.  A digital rectal exam revealed no abnormalities of the rectum.   The [id number]  endoscope was introduced through the anus and advanced to the cecum, which was identified by both the appendix and ileocecal valve. No adverse events experienced.   The quality of the prep was excellent using Suprep  The instrument was then slowly withdrawn as the colon was fully examined.      COLON FINDINGS: A flat polyp measuring 2 mm in size was found in the proximal transverse colon.  A polypectomy was performed with cold forceps.   Mild diverticulosis was noted in the sigmoid colon. Internal hemorrhoids were found.  Retroflexed views revealed no abnormalities. The time to cecum=2 minutes 51 seconds.  Withdrawal time=6 minutes 36 seconds.  The scope was withdrawn and the procedure completed. COMPLICATIONS: There were no complications.  ENDOSCOPIC IMPRESSION: 1.   Flat polyp measuring 2 mm in size was found in the proximal transverse colon;  polypectomy was performed with cold forceps 2.   Mild diverticulosis was noted in the sigmoid colon 3.   Internal hemorrhoids  RECOMMENDATIONS: If the polyp(s) removed today are proven to be adenomatous (pre-cancerous) polyps, you will need a repeat colonoscopy in 5 years.  Otherwise you should continue to follow colorectal cancer screening guidelines for "routine risk" patients with colonoscopy in 10 years.  You will receive a letter within 1-2 weeks with the results of your biopsy as well as final recommendations.  Please call my office if you have not received a letter after 3 weeks.   eSigned:  Inda Castle, MD 09/17/2013 1:59 PM   cc:   PATIENT NAME:  David Chen, David Chen MR#: [id number]

## 2013-09-17 NOTE — Patient Instructions (Addendum)
YOU HAD AN ENDOSCOPIC PROCEDURE TODAY AT THE [facility]: Refer to the procedure report that was given to you for any specific questions about what was found during the examination.  If the procedure report does not answer your questions, please call your gastroenterologist to clarify.  If you requested that your care partner not be given the details of your procedure findings, then the procedure report has been included in a sealed envelope for you to review at your convenience later.  YOU SHOULD EXPECT: Some feelings of bloating in the abdomen. Passage of more gas than usual.  Walking can help get rid of the air that was put into your GI tract during the procedure and reduce the bloating. If you had a lower endoscopy (such as a colonoscopy or flexible sigmoidoscopy) you may notice spotting of blood in your stool or on the toilet paper. If you underwent a bowel prep for your procedure, then you may not have a normal bowel movement for a few days.  DIET: Your first meal following the procedure should be a light meal and then it is ok to progress to your normal diet.  A half-sandwich or bowl of soup is an example of a good first meal.  Heavy or fried foods are harder to digest and may make you feel nauseous or bloated.  Likewise meals heavy in dairy and vegetables can cause extra gas to form and this can also increase the bloating.  Drink plenty of fluids but you should avoid alcoholic beverages for 24 hours.  ACTIVITY: Your care partner should take you home directly after the procedure.  You should plan to take it easy, moving slowly for the rest of the day.  You can resume normal activity the day after the procedure however you should NOT DRIVE or use heavy machinery for 24 hours (because of the sedation medicines used during the test).    SYMPTOMS TO REPORT IMMEDIATELY: A gastroenterologist can be reached at any hour.  During normal business hours, 8:30 AM to 5:00 PM Monday through Friday,  call [phone].  After hours and on weekends, please call the GI answering service at [phone] who will take a message and have the physician on call contact you.   Following lower endoscopy (colonoscopy or flexible sigmoidoscopy):  Excessive amounts of blood in the stool  Significant tenderness or worsening of abdominal pains  Swelling of the abdomen that is new, acute  Fever of 100F or higher  FOLLOW UP: If any biopsies were taken you will be contacted by phone or by letter within the next 1-3 weeks.  Call your gastroenterologist if you have not heard about the biopsies in 3 weeks.  Our staff will call the home number listed on your records the next business day following your procedure to check on you and address any questions or concerns that you may have at that time regarding the information given to you following your procedure. This is a courtesy call and so if there is no answer at the home number and we have not heard from you through the emergency physician on call, we will assume that you have returned to your regular daily activities without incident.  SIGNATURES/CONFIDENTIALITY: You and/or your care partner have signed paperwork which will be entered into your electronic medical record.  These signatures attest to the fact that that the information above on your After Visit Summary has been reviewed and is understood.  Full responsibility of the confidentiality of this   discharge information lies with you and/or your care-partner.  Diverticulosis, polyps, high fiber diet, hemorrhoids-handouts given  Repeat colonoscopy will be determined by pathology.

## 2013-09-17 NOTE — Progress Notes (Signed)
Called to room to assist during endoscopic procedure.  Patient ID and intended procedure confirmed with present staff. Received instructions for my participation in the procedure from the performing physician.  

## 2013-09-20 ENCOUNTER — Telehealth: Payer: Self-pay

## 2013-09-20 NOTE — Telephone Encounter (Signed)
Left message on answering machine. 

## 2013-09-22 ENCOUNTER — Encounter: Payer: Self-pay | Admitting: Gastroenterology

## 2014-12-02 ENCOUNTER — Encounter: Payer: Self-pay | Admitting: Gastroenterology

## 2017-03-27 DIAGNOSIS — K219 Gastro-esophageal reflux disease without esophagitis: Secondary | ICD-10-CM | POA: Diagnosis not present

## 2017-03-27 DIAGNOSIS — D67 Hereditary factor IX deficiency: Secondary | ICD-10-CM | POA: Diagnosis not present

## 2017-03-27 DIAGNOSIS — E669 Obesity, unspecified: Secondary | ICD-10-CM | POA: Diagnosis not present

## 2017-03-27 DIAGNOSIS — I10 Essential (primary) hypertension: Secondary | ICD-10-CM | POA: Diagnosis not present

## 2017-03-27 DIAGNOSIS — N138 Other obstructive and reflux uropathy: Secondary | ICD-10-CM | POA: Diagnosis not present

## 2017-03-27 DIAGNOSIS — E785 Hyperlipidemia, unspecified: Secondary | ICD-10-CM | POA: Diagnosis not present

## 2017-03-27 DIAGNOSIS — Z23 Encounter for immunization: Secondary | ICD-10-CM | POA: Diagnosis not present

## 2017-03-27 DIAGNOSIS — Z79899 Other long term (current) drug therapy: Secondary | ICD-10-CM | POA: Diagnosis not present

## 2017-03-27 DIAGNOSIS — R69 Illness, unspecified: Secondary | ICD-10-CM | POA: Diagnosis not present

## 2017-03-27 DIAGNOSIS — N401 Enlarged prostate with lower urinary tract symptoms: Secondary | ICD-10-CM | POA: Diagnosis not present

## 2017-04-07 DIAGNOSIS — J209 Acute bronchitis, unspecified: Secondary | ICD-10-CM | POA: Diagnosis not present

## 2017-04-07 DIAGNOSIS — Z6829 Body mass index (BMI) 29.0-29.9, adult: Secondary | ICD-10-CM | POA: Diagnosis not present

## 2017-04-16 DIAGNOSIS — Z0000 Encounter for general adult medical examination without abnormal findings: Secondary | ICD-10-CM | POA: Diagnosis not present

## 2017-05-27 DIAGNOSIS — Z8249 Family history of ischemic heart disease and other diseases of the circulatory system: Secondary | ICD-10-CM | POA: Diagnosis not present

## 2017-05-27 DIAGNOSIS — I10 Essential (primary) hypertension: Secondary | ICD-10-CM | POA: Diagnosis not present

## 2017-05-27 DIAGNOSIS — Z87891 Personal history of nicotine dependence: Secondary | ICD-10-CM | POA: Diagnosis not present

## 2017-05-27 DIAGNOSIS — Z880 Allergy status to penicillin: Secondary | ICD-10-CM | POA: Diagnosis not present

## 2017-05-27 DIAGNOSIS — E785 Hyperlipidemia, unspecified: Secondary | ICD-10-CM | POA: Diagnosis not present

## 2017-05-27 DIAGNOSIS — Z809 Family history of malignant neoplasm, unspecified: Secondary | ICD-10-CM | POA: Diagnosis not present

## 2017-06-25 DIAGNOSIS — D235 Other benign neoplasm of skin of trunk: Secondary | ICD-10-CM | POA: Diagnosis not present

## 2017-06-25 DIAGNOSIS — D2272 Melanocytic nevi of left lower limb, including hip: Secondary | ICD-10-CM | POA: Diagnosis not present

## 2017-06-25 DIAGNOSIS — L821 Other seborrheic keratosis: Secondary | ICD-10-CM | POA: Diagnosis not present

## 2017-06-25 DIAGNOSIS — D1801 Hemangioma of skin and subcutaneous tissue: Secondary | ICD-10-CM | POA: Diagnosis not present

## 2017-06-25 DIAGNOSIS — D225 Melanocytic nevi of trunk: Secondary | ICD-10-CM | POA: Diagnosis not present

## 2017-06-25 DIAGNOSIS — D2271 Melanocytic nevi of right lower limb, including hip: Secondary | ICD-10-CM | POA: Diagnosis not present

## 2017-06-25 DIAGNOSIS — D485 Neoplasm of uncertain behavior of skin: Secondary | ICD-10-CM | POA: Diagnosis not present

## 2017-06-25 DIAGNOSIS — L918 Other hypertrophic disorders of the skin: Secondary | ICD-10-CM | POA: Diagnosis not present

## 2017-06-25 DIAGNOSIS — D2372 Other benign neoplasm of skin of left lower limb, including hip: Secondary | ICD-10-CM | POA: Diagnosis not present

## 2017-08-15 DIAGNOSIS — R69 Illness, unspecified: Secondary | ICD-10-CM | POA: Diagnosis not present

## 2017-10-22 DIAGNOSIS — Z0100 Encounter for examination of eyes and vision without abnormal findings: Secondary | ICD-10-CM | POA: Diagnosis not present

## 2017-12-03 DIAGNOSIS — D171 Benign lipomatous neoplasm of skin and subcutaneous tissue of trunk: Secondary | ICD-10-CM | POA: Diagnosis not present

## 2017-12-03 DIAGNOSIS — K4090 Unilateral inguinal hernia, without obstruction or gangrene, not specified as recurrent: Secondary | ICD-10-CM | POA: Diagnosis not present

## 2017-12-03 DIAGNOSIS — E669 Obesity, unspecified: Secondary | ICD-10-CM | POA: Diagnosis not present

## 2017-12-03 DIAGNOSIS — D67 Hereditary factor IX deficiency: Secondary | ICD-10-CM | POA: Diagnosis not present

## 2017-12-03 DIAGNOSIS — Z6827 Body mass index (BMI) 27.0-27.9, adult: Secondary | ICD-10-CM | POA: Diagnosis not present

## 2017-12-16 ENCOUNTER — Encounter: Payer: Self-pay | Admitting: Surgery

## 2017-12-16 DIAGNOSIS — K4020 Bilateral inguinal hernia, without obstruction or gangrene, not specified as recurrent: Secondary | ICD-10-CM | POA: Diagnosis not present

## 2017-12-16 DIAGNOSIS — D67 Hereditary factor IX deficiency: Secondary | ICD-10-CM | POA: Diagnosis not present

## 2018-02-19 ENCOUNTER — Telehealth: Payer: Self-pay | Admitting: Oncology

## 2018-02-19 ENCOUNTER — Encounter: Payer: Self-pay | Admitting: Oncology

## 2018-02-19 NOTE — Telephone Encounter (Signed)
A new hem appt has been scheduled for the pt to re-establish care w/Dr. Alen Blew on 3/17 at 11am. Pt agreed to the appt date and time. Letter mailed.

## 2018-03-11 ENCOUNTER — Encounter: Payer: Self-pay | Admitting: Oncology

## 2018-03-24 ENCOUNTER — Telehealth: Payer: Self-pay | Admitting: Oncology

## 2018-03-24 ENCOUNTER — Inpatient Hospital Stay: Payer: Medicare HMO | Attending: Oncology | Admitting: Oncology

## 2018-03-24 ENCOUNTER — Other Ambulatory Visit: Payer: Self-pay

## 2018-03-24 VITALS — BP 135/84 | HR 64 | Temp 98.2°F | Resp 17 | Ht 67.0 in | Wt 180.6 lb

## 2018-03-24 DIAGNOSIS — D67 Hereditary factor IX deficiency: Secondary | ICD-10-CM | POA: Insufficient documentation

## 2018-03-24 DIAGNOSIS — Z79899 Other long term (current) drug therapy: Secondary | ICD-10-CM | POA: Insufficient documentation

## 2018-03-24 DIAGNOSIS — K4090 Unilateral inguinal hernia, without obstruction or gangrene, not specified as recurrent: Secondary | ICD-10-CM | POA: Insufficient documentation

## 2018-03-24 DIAGNOSIS — R69 Illness, unspecified: Secondary | ICD-10-CM | POA: Diagnosis not present

## 2018-03-24 NOTE — Progress Notes (Signed)
Hematology and Oncology Follow Up Visit  David Chen [id number] Aug 23, 1950 68 y.o. 03/24/2018 10:57 AM   Dr. Vilinda Blanks - [facility]   Principle Diagnosis:  75 -year-old man with factor IX deficiency documented and 2006.  Factor IX level was around 21%.     Previous therapy: Factor IX replacement prior to any surgical procedure. 7446 IU of factor IX replacement daily for 3 days.    Interim History: David Chen returns today for a repeat evaluation.  Since last visit, he reports no major changes in his health.  He has not had any bleeding complications after dental procedure or any spontaneous bleed.  He continues to be active and attends to activities of daily living.  He has developed a right inguinal hernia and considering a repair in the near future.  Patient denied headaches, blurry vision, syncope or seizures.  Denies any fevers, chills or sweats.  Denied chest pain, palpitation, orthopnea or leg edema.  Denied cough, wheezing or hemoptysis.  Denied nausea, vomiting or abdominal pain.  Denies any constipation or diarrhea.  Denies any frequency urgency or hesitancy.  Denies any arthralgias or myalgias.  Denies any skin rashes or lesions.  Denies any bleeding or clotting tendency.  Denies any easy bruising.  Denies any hair or nail changes.  Denies any anxiety or depression.  Remaining review of system is negative.     Medications: I have reviewed the patient's current medications.  Current Outpatient Medications:  .  acetaminophen (TYLENOL) 500 MG tablet, Take 1,000 mg by mouth every 6 (six) hours as needed. For pain, Disp: , Rfl:  .  atorvastatin (LIPITOR) 40 MG tablet, Take 40 mg by mouth every morning.  , Disp: , Rfl:  .  fish oil-omega-3 fatty acids 1000 MG capsule, Take 2 g by mouth daily.  , Disp: , Rfl:  .  ibuprofen (ADVIL,MOTRIN) 200 MG tablet, Take 200 mg by mouth as needed. For sinus pain, Disp: , Rfl:  .  PARoxetine (PAXIL-CR) 12.5 MG 24 hr tablet, Take 12.5  mg by mouth every other day., Disp: , Rfl:  .  sodium chloride (OCEAN) 0.65 % nasal spray, Place 1 spray into the nose as needed. For nasal drainage, Disp: , Rfl:  .  VALSARTAN-HYDROCHLOROTHIAZIDE PO, Take 1 tablet by mouth daily., Disp: , Rfl:   Allergies:  Allergies  Allergen Reactions  . Penicillins Hives    Past Medical History, Surgical history, Social history, and Family History were reviewed and updated.   Physical Exam: Blood pressure 135/84, pulse 64, temperature 98.2 F (36.8 C), temperature source Oral, resp. rate 17, height 5\' 7"  (1.702 m), weight 180 lb 9.6 oz (81.9 kg), SpO2 100 %.   ECOG: 0   General appearance: Comfortable appearing without any discomfort Head: Normocephalic without any trauma Oropharynx: Mucous membranes are moist and pink without any thrush or ulcers. Eyes: Pupils are equal and round reactive to light. Lymph nodes: No cervical, supraclavicular, inguinal or axillary lymphadenopathy.   Heart:regular rate and rhythm.  S1 and S2 without leg edema. Lung: Clear without any rhonchi or wheezes.  No dullness to percussion. Abdomin: Soft, nontender, nondistended with good bowel sounds.  No hepatosplenomegaly. Musculoskeletal: No joint deformity or effusion.  Full range of motion noted. Neurological: No deficits noted on motor, sensory and deep tendon reflex exam. Skin: No petechial rash or dryness.  Appeared moist.     Lab Results: Lab Results  Component Value Date   WBC 5.2 01/18/2011   HGB 14.4  01/18/2011   HCT 42.0 01/18/2011   MCV 86.2 01/18/2011   PLT 215 01/18/2011     Chemistry      Component Value Date/Time   NA 140 01/18/2011 0615   K 4.6 01/18/2011 0615   CL 106 01/18/2011 0615   CO2 26 01/18/2011 0615   BUN 19 01/18/2011 0615   CREATININE 1.06 01/18/2011 0615      Component Value Date/Time   CALCIUM 9.5 01/18/2011 0615   ALKPHOS 60 01/18/2011 0615   AST 20 01/18/2011 0615   ALT 23 01/18/2011 0615   BILITOT 1.5 (H)  01/18/2011 0615         Impression and Plan:  82 -year-old man with:   1.  Factor IX deficiency diagnosed in 2006.  He has received product replacement in the form of BeneFIX most recently in 2013 after laparoscopic cholecystectomy.  A total dose of 7446 IU given for 3 days including the day of his surgery postoperatively without any bleeding.  The natural course of this disease as well as bleeding risk was discussed today and treatment options were reviewed.  Given his anticipated surgery in the future he would benefit from product replacement and preparation for surgery to get his level to close to 100%.  Given the fact that it is a minor surgery 3 days of product replacement should be adequate with 2 days prior to his procedure and 1 day after the procedure to make sure.  He will contact me closer to his surgery date and we will arrange for that.  2.  Right inguinal hernia: Considering surgery at this time.  I advised to defer surgery for the time being and do it only if needed to.  I have recommended deferring it at least 4 to 6 weeks if he can and will receive factor IX product as mentioned above.  3.  Follow-up: We will be determined by his date of surgery.  25  minutes was spent with the patient face-to-face today.  More than 50% of time was dedicated to discuss her is disease status, treatment options and answering questions regarding future plan of care.          Zola Button, MD 3/17/202010:57 AM

## 2018-03-24 NOTE — Telephone Encounter (Signed)
No los °

## 2018-03-25 ENCOUNTER — Inpatient Hospital Stay: Payer: Self-pay | Admitting: Oncology

## 2018-05-22 DIAGNOSIS — R69 Illness, unspecified: Secondary | ICD-10-CM | POA: Diagnosis not present

## 2018-05-22 DIAGNOSIS — Z79899 Other long term (current) drug therapy: Secondary | ICD-10-CM | POA: Diagnosis not present

## 2018-05-22 DIAGNOSIS — Z1159 Encounter for screening for other viral diseases: Secondary | ICD-10-CM | POA: Diagnosis not present

## 2018-05-22 DIAGNOSIS — R300 Dysuria: Secondary | ICD-10-CM | POA: Diagnosis not present

## 2018-05-22 DIAGNOSIS — R739 Hyperglycemia, unspecified: Secondary | ICD-10-CM | POA: Diagnosis not present

## 2018-05-22 DIAGNOSIS — N401 Enlarged prostate with lower urinary tract symptoms: Secondary | ICD-10-CM | POA: Diagnosis not present

## 2018-05-22 DIAGNOSIS — E785 Hyperlipidemia, unspecified: Secondary | ICD-10-CM | POA: Diagnosis not present

## 2018-05-22 DIAGNOSIS — I10 Essential (primary) hypertension: Secondary | ICD-10-CM | POA: Diagnosis not present

## 2018-05-22 DIAGNOSIS — N138 Other obstructive and reflux uropathy: Secondary | ICD-10-CM | POA: Diagnosis not present

## 2018-05-22 DIAGNOSIS — Z0000 Encounter for general adult medical examination without abnormal findings: Secondary | ICD-10-CM | POA: Diagnosis not present

## 2018-05-22 DIAGNOSIS — K219 Gastro-esophageal reflux disease without esophagitis: Secondary | ICD-10-CM | POA: Diagnosis not present

## 2018-06-27 DIAGNOSIS — H524 Presbyopia: Secondary | ICD-10-CM | POA: Diagnosis not present

## 2018-06-27 DIAGNOSIS — Z0100 Encounter for examination of eyes and vision without abnormal findings: Secondary | ICD-10-CM | POA: Diagnosis not present

## 2018-07-23 DIAGNOSIS — R69 Illness, unspecified: Secondary | ICD-10-CM | POA: Diagnosis not present

## 2018-07-30 DIAGNOSIS — D225 Melanocytic nevi of trunk: Secondary | ICD-10-CM | POA: Diagnosis not present

## 2018-07-30 DIAGNOSIS — L821 Other seborrheic keratosis: Secondary | ICD-10-CM | POA: Diagnosis not present

## 2018-07-30 DIAGNOSIS — D1801 Hemangioma of skin and subcutaneous tissue: Secondary | ICD-10-CM | POA: Diagnosis not present

## 2018-07-30 DIAGNOSIS — D2272 Melanocytic nevi of left lower limb, including hip: Secondary | ICD-10-CM | POA: Diagnosis not present

## 2018-07-30 DIAGNOSIS — D2239 Melanocytic nevi of other parts of face: Secondary | ICD-10-CM | POA: Diagnosis not present

## 2018-07-30 DIAGNOSIS — D2271 Melanocytic nevi of right lower limb, including hip: Secondary | ICD-10-CM | POA: Diagnosis not present

## 2018-07-30 DIAGNOSIS — D2262 Melanocytic nevi of left upper limb, including shoulder: Secondary | ICD-10-CM | POA: Diagnosis not present

## 2018-08-11 ENCOUNTER — Encounter: Payer: Self-pay | Admitting: Gastroenterology

## 2018-08-27 DIAGNOSIS — Z20828 Contact with and (suspected) exposure to other viral communicable diseases: Secondary | ICD-10-CM | POA: Diagnosis not present

## 2018-08-27 DIAGNOSIS — J029 Acute pharyngitis, unspecified: Secondary | ICD-10-CM | POA: Diagnosis not present

## 2018-08-27 DIAGNOSIS — Z7189 Other specified counseling: Secondary | ICD-10-CM | POA: Diagnosis not present

## 2018-08-27 DIAGNOSIS — B349 Viral infection, unspecified: Secondary | ICD-10-CM | POA: Diagnosis not present

## 2018-09-11 ENCOUNTER — Other Ambulatory Visit: Payer: Self-pay

## 2018-09-11 ENCOUNTER — Ambulatory Visit (AMBULATORY_SURGERY_CENTER): Payer: Self-pay | Admitting: *Deleted

## 2018-09-11 ENCOUNTER — Encounter: Payer: Self-pay | Admitting: Gastroenterology

## 2018-09-11 VITALS — Temp 96.9°F | Ht 66.0 in | Wt 180.0 lb

## 2018-09-11 DIAGNOSIS — Z86010 Personal history of colonic polyps: Secondary | ICD-10-CM

## 2018-09-11 MED ORDER — SUPREP BOWEL PREP KIT 17.5-3.13-1.6 GM/177ML PO SOLN: 1.0000 | Freq: Once | ORAL | 0 refills | Status: AC

## 2018-09-11 NOTE — Progress Notes (Signed)
No egg or soy allergy known to patient  No issues with past sedation with any surgeries  or procedures, no intubation problems  No diet pills per patient No home 02 use per patient  No blood thinners per patient  Pt denies issues with constipation  No A fib or A flutter  EMMI video declined  Pt has Factor IX def and will need cautery with any polyps removed

## 2018-09-25 ENCOUNTER — Other Ambulatory Visit: Payer: Self-pay

## 2018-09-25 ENCOUNTER — Encounter: Payer: Self-pay | Admitting: Gastroenterology

## 2018-09-25 ENCOUNTER — Ambulatory Visit (AMBULATORY_SURGERY_CENTER): Payer: Medicare HMO | Admitting: Gastroenterology

## 2018-09-25 ENCOUNTER — Other Ambulatory Visit: Payer: Self-pay | Admitting: Gastroenterology

## 2018-09-25 VITALS — BP 127/77 | HR 49 | Temp 97.9°F | Resp 12 | Ht 66.0 in | Wt 180.0 lb

## 2018-09-25 DIAGNOSIS — G4733 Obstructive sleep apnea (adult) (pediatric): Secondary | ICD-10-CM | POA: Diagnosis not present

## 2018-09-25 DIAGNOSIS — K635 Polyp of colon: Secondary | ICD-10-CM | POA: Diagnosis not present

## 2018-09-25 DIAGNOSIS — Z86010 Personal history of colonic polyps: Secondary | ICD-10-CM | POA: Diagnosis not present

## 2018-09-25 DIAGNOSIS — D123 Benign neoplasm of transverse colon: Secondary | ICD-10-CM

## 2018-09-25 DIAGNOSIS — I10 Essential (primary) hypertension: Secondary | ICD-10-CM | POA: Diagnosis not present

## 2018-09-25 MED ORDER — SODIUM CHLORIDE 0.9 % IV SOLN: 500.0000 mL | Freq: Once | INTRAVENOUS | Status: DC

## 2018-09-25 NOTE — Progress Notes (Addendum)
Pt's states no medical or surgical changes since previsit or office visit.  Pt states that he has Factor 9 and bleeds easily.  He states that he has bleeding from polypectomy in the past and requires cautery to avoid post-polypectomy bleeding.

## 2018-09-25 NOTE — Progress Notes (Signed)
Temp KA VS CW

## 2018-09-25 NOTE — Op Note (Signed)
[facility] Patient Name: David Chen Procedure Date: 09/25/2018 9:15 AM MRN: [id number] Endoscopist: Mallie Mussel L. Loletha Carrow , MD Age: 68 Referring MD:  Date of Birth: Jan 11, 1950 Gender: Male Account #: [id number] Procedure:                Colonoscopy Indications:              Surveillance: Personal history of adenomatous                            polyps on last colonoscopy 5 years ago Medicines:                Monitored Anesthesia Care Procedure:                Pre-Anesthesia Assessment:                           - Prior to the procedure, a History and Physical                            was performed, and patient medications and                            allergies were reviewed. The patient's tolerance of                            previous anesthesia was also reviewed. The risks                            and benefits of the procedure and the sedation                            options and risks were discussed with the patient.                            All questions were answered, and informed consent                            was obtained. Prior Anticoagulants: The patient has                            taken no previous anticoagulant or antiplatelet                            agents. ASA Grade Assessment: III - A patient with                            severe systemic disease. After reviewing the risks                            and benefits, the patient was deemed in                            satisfactory condition to undergo the procedure.  After obtaining informed consent, the colonoscope                            was passed under direct vision. Throughout the                            procedure, the patient's blood pressure, pulse, and                            oxygen saturations were monitored continuously. The                            Colonoscope was introduced through the anus and                            advanced to the the cecum,  identified by                            appendiceal orifice and ileocecal valve. The                            colonoscopy was performed without difficulty. The                            patient tolerated the procedure well. The quality                            of the bowel preparation was excellent. The                            ileocecal valve, appendiceal orifice, and rectum                            were photographed. Scope In: 9:22:03 AM Scope Out: 9:37:12 AM Scope Withdrawal Time: 0 hours 12 minutes 23 seconds  Total Procedure Duration: 0 hours 15 minutes 9 seconds  Findings:                 The perianal and digital rectal examinations were                            normal.                           A 8 mm polyp was found in the proximal transverse                            colon. The polyp was semi-sessile. The polyp was                            removed with a hot snare. Resection and retrieval                            were complete.  A few small-mouthed diverticula were found in the                            left colon.                           The exam was otherwise without abnormality on                            direct and retroflexion views. Complications:            No immediate complications. Estimated Blood Loss:     Estimated blood loss: none. Impression:               - One 8 mm polyp in the proximal transverse colon,                            removed with a hot snare. Resected and retrieved.                           - Diverticulosis in the left colon.                           - The examination was otherwise normal on direct                            and retroflexion views. Recommendation:           - Patient has a contact number available for                            emergencies. The signs and symptoms of potential                            delayed complications were discussed with the                            patient.  Return to normal activities tomorrow.                            Written discharge instructions were provided to the                            patient.                           - Resume previous diet.                           - Continue present medications.                           - Await pathology results.                           - Repeat colonoscopy is recommended for  surveillance. The colonoscopy date will be                            determined after pathology results from today's                            exam become available for review. Shlonda Dolloff L. Loletha Carrow, MD 09/25/2018 9:43:29 AM This report has been signed electronically.

## 2018-09-25 NOTE — Progress Notes (Signed)
Report given to PACU, vss 

## 2018-09-25 NOTE — Patient Instructions (Signed)
Handout on polyps, diverticulosis given.   YOU HAD AN ENDOSCOPIC PROCEDURE TODAY AT THE [facility]:   Refer to the procedure report that was given to you for any specific questions about what was found during the examination.  If the procedure report does not answer your questions, please call your gastroenterologist to clarify.  If you requested that your care partner not be given the details of your procedure findings, then the procedure report has been included in a sealed envelope for you to review at your convenience later.  YOU SHOULD EXPECT: Some feelings of bloating in the abdomen. Passage of more gas than usual.  Walking can help get rid of the air that was put into your GI tract during the procedure and reduce the bloating. If you had a lower endoscopy (such as a colonoscopy or flexible sigmoidoscopy) you may notice spotting of blood in your stool or on the toilet paper. If you underwent a bowel prep for your procedure, you may not have a normal bowel movement for a few days.  Please Note:  You might notice some irritation and congestion in your nose or some drainage.  This is from the oxygen used during your procedure.  There is no need for concern and it should clear up in a day or so.  SYMPTOMS TO REPORT IMMEDIATELY:   Following lower endoscopy (colonoscopy or flexible sigmoidoscopy):  Excessive amounts of blood in the stool  Significant tenderness or worsening of abdominal pains  Swelling of the abdomen that is new, acute  Fever of 100F or higher  For urgent or emergent issues, a gastroenterologist can be reached at any hour by calling [phone].   DIET:  We do recommend a small meal at first, but then you may proceed to your regular diet.  Drink plenty of fluids but you should avoid alcoholic beverages for 24 hours.  ACTIVITY:  You should plan to take it easy for the rest of today and you should NOT DRIVE or use heavy machinery until tomorrow (because of the  sedation medicines used during the test).    FOLLOW UP: Our staff will call the number listed on your records 48-72 hours following your procedure to check on you and address any questions or concerns that you may have regarding the information given to you following your procedure. If we do not reach you, we will leave a message.  We will attempt to reach you two times.  During this call, we will ask if you have developed any symptoms of COVID 19. If you develop any symptoms (ie: fever, flu-like symptoms, shortness of breath, cough etc.) before then, please call [phone].  If you test positive for Covid 19 in the 2 weeks post procedure, please call and report this information to us.    If any biopsies were taken you will be contacted by phone or by letter within the next 1-3 weeks.  Please call us at [phone] if you have not heard about the biopsies in 3 weeks.    SIGNATURES/CONFIDENTIALITY: You and/or your care partner have signed paperwork which will be entered into your electronic medical record.  These signatures attest to the fact that that the information above on your After Visit Summary has been reviewed and is understood.  Full responsibility of the confidentiality of this discharge information lies with you and/or your care-partner. 

## 2018-09-25 NOTE — Progress Notes (Signed)
Called to room to assist during endoscopic procedure.  Patient ID and intended procedure confirmed with present staff. Received instructions for my participation in the procedure from the performing physician.  

## 2018-09-27 ENCOUNTER — Telehealth: Payer: Self-pay | Admitting: Physician Assistant

## 2018-09-27 ENCOUNTER — Observation Stay (HOSPITAL_COMMUNITY)
Admission: EM | Admit: 2018-09-27 | Discharge: 2018-09-28 | Disposition: A | Discharge status: Home / Self Care | Payer: Medicare HMO | Attending: Internal Medicine | Admitting: Internal Medicine

## 2018-09-27 ENCOUNTER — Encounter (HOSPITAL_COMMUNITY): Payer: Self-pay | Admitting: Emergency Medicine

## 2018-09-27 ENCOUNTER — Other Ambulatory Visit: Payer: Self-pay

## 2018-09-27 DIAGNOSIS — E7800 Pure hypercholesterolemia, unspecified: Secondary | ICD-10-CM | POA: Diagnosis not present

## 2018-09-27 DIAGNOSIS — G4730 Sleep apnea, unspecified: Secondary | ICD-10-CM | POA: Diagnosis not present

## 2018-09-27 DIAGNOSIS — D67 Hereditary factor IX deficiency: Secondary | ICD-10-CM | POA: Diagnosis present

## 2018-09-27 DIAGNOSIS — Z20828 Contact with and (suspected) exposure to other viral communicable diseases: Secondary | ICD-10-CM | POA: Diagnosis not present

## 2018-09-27 DIAGNOSIS — Z79899 Other long term (current) drug therapy: Secondary | ICD-10-CM | POA: Insufficient documentation

## 2018-09-27 DIAGNOSIS — Z87891 Personal history of nicotine dependence: Secondary | ICD-10-CM | POA: Diagnosis not present

## 2018-09-27 DIAGNOSIS — I10 Essential (primary) hypertension: Secondary | ICD-10-CM | POA: Diagnosis not present

## 2018-09-27 DIAGNOSIS — K91840 Postprocedural hemorrhage and hematoma of a digestive system organ or structure following a digestive system procedure: Secondary | ICD-10-CM | POA: Diagnosis not present

## 2018-09-27 DIAGNOSIS — K625 Hemorrhage of anus and rectum: Secondary | ICD-10-CM

## 2018-09-27 DIAGNOSIS — Z03818 Encounter for observation for suspected exposure to other biological agents ruled out: Secondary | ICD-10-CM | POA: Diagnosis not present

## 2018-09-27 DIAGNOSIS — Z0184 Encounter for antibody response examination: Secondary | ICD-10-CM | POA: Insufficient documentation

## 2018-09-27 DIAGNOSIS — K921 Melena: Secondary | ICD-10-CM

## 2018-09-27 DIAGNOSIS — Z880 Allergy status to penicillin: Secondary | ICD-10-CM | POA: Diagnosis not present

## 2018-09-27 DIAGNOSIS — K922 Gastrointestinal hemorrhage, unspecified: Secondary | ICD-10-CM

## 2018-09-27 DIAGNOSIS — Z86010 Personal history of colonic polyps: Secondary | ICD-10-CM | POA: Insufficient documentation

## 2018-09-27 HISTORY — DX: Gastrointestinal hemorrhage, unspecified: K92.2

## 2018-09-27 LAB — CBC WITH DIFFERENTIAL/PLATELET
Abs Immature Granulocytes: 0.01 10*3/uL (ref 0.00–0.07)
Basophils Absolute: 0 10*3/uL (ref 0.0–0.1)
Basophils Relative: 1 %
Eosinophils Absolute: 0.1 10*3/uL (ref 0.0–0.5)
Eosinophils Relative: 1 %
HCT: 45.6 % (ref 39.0–52.0)
Hemoglobin: 15.3 g/dL (ref 13.0–17.0)
Immature Granulocytes: 0 %
Lymphocytes Relative: 26 %
Lymphs Abs: 1.4 10*3/uL (ref 0.7–4.0)
MCH: 30.7 pg (ref 26.0–34.0)
MCHC: 33.6 g/dL (ref 30.0–36.0)
MCV: 91.4 fL (ref 80.0–100.0)
Monocytes Absolute: 0.5 10*3/uL (ref 0.1–1.0)
Monocytes Relative: 10 %
Neutro Abs: 3.4 10*3/uL (ref 1.7–7.7)
Neutrophils Relative %: 62 %
Platelets: 256 10*3/uL (ref 150–400)
RBC: 4.99 MIL/uL (ref 4.22–5.81)
RDW: 12.5 % (ref 11.5–15.5)
WBC: 5.5 10*3/uL (ref 4.0–10.5)
nRBC: 0 % (ref 0.0–0.2)

## 2018-09-27 LAB — BASIC METABOLIC PANEL
Anion gap: 7 (ref 5–15)
BUN: 18 mg/dL (ref 8–23)
CO2: 26 mmol/L (ref 22–32)
Calcium: 9.3 mg/dL (ref 8.9–10.3)
Chloride: 104 mmol/L (ref 98–111)
Creatinine, Ser: 1.09 mg/dL (ref 0.61–1.24)
GFR calc Af Amer: >60 mL/min (ref >60)
GFR calc non Af Amer: >60 mL/min (ref >60)
Glucose, Bld: 98 mg/dL (ref 70–99)
Potassium: 4 mmol/L (ref 3.5–5.1)
Sodium: 137 mmol/L (ref 135–145)

## 2018-09-27 LAB — HEMOGLOBIN AND HEMATOCRIT, BLOOD
HCT: 45.3 % (ref 39.0–52.0)
Hemoglobin: 15 g/dL (ref 13.0–17.0)

## 2018-09-27 LAB — PROTIME-INR
INR: 0.9 (ref 0.8–1.2)
Prothrombin Time: 12.1 seconds (ref 11.4–15.2)

## 2018-09-27 MED ORDER — BISACODYL 5 MG PO TBEC
10.0000 mg | DELAYED_RELEASE_TABLET | Freq: Once | ORAL | Status: AC
  Administered 2018-09-27: 10 mg via ORAL
  Filled 2018-09-27: qty 2

## 2018-09-27 MED ORDER — PEG-KCL-NACL-NASULF-NA ASC-C 100 G PO SOLR
0.5000 | Freq: Once | ORAL | Status: AC
  Administered 2018-09-27: 100 g via ORAL
  Filled 2018-09-27: qty 1

## 2018-09-27 MED ORDER — PANTOPRAZOLE SODIUM 40 MG IV SOLR
40.0000 mg | Freq: Every day | INTRAVENOUS | Status: DC
  Administered 2018-09-27: 40 mg via INTRAVENOUS
  Filled 2018-09-27: qty 40

## 2018-09-27 MED ORDER — HYDRALAZINE HCL 20 MG/ML IJ SOLN: 10.0000 mg | Freq: Three times a day (TID) | INTRAMUSCULAR | Status: DC | PRN

## 2018-09-27 NOTE — H&P (Signed)
.  History and Physical    [name] [id number] DOB: Jan 02, 1951 DOA: 09/27/2018  PCP: Venetia Maxon, Sharon Mt, MD  Patient coming from: Home   Chief Complaint: "bloody stools"  HPI: David Chen is a 68 y.o. male with medical history significant of Factor IX deficiency. Presents with bloody stools. Reports colonoscopy with polypectomy 2 days ago. He was fine until yesterday when his noticed a dark stool with blood. I thought it would pass; however when he had a bowel movement this morning, there was more blood. He talked to his GI doc and came to the ED. He denies any aggravating or alleviating factors. No other associated symptoms. He notes is bleed only happening when he has a BM.   ED Course: D/w GI. Concern for ongoing bleed. Requested admission for Hgb monitor and c-scope in AM. [facility] called for admission.   Review of Systems: Denies CP, dyspnea, weakness, hematuria, hematemesis, ab pain, N, V. Remainder of 10 point review of systems is otherwise negative for all not mentioned in HPI.    Past Medical History:  Diagnosis Date  . Arthritis    lower back but not diagnosed per pt  . Asthma   . Clotting disorder [facility])    Managed by Dr. Alen Blew will see him Thursday for a Transfusion, unsure type not been diagnosed  . Diverticulosis   . Factor IX deficiency ([facility])   . High cholesterol   . Hx of adenomatous colonic polyps   . Hypertension    does not see a cardiologist  . Inguinal hernia    on the right side- not causing issues , no pain per pt 09-11-2018  . Sleep apnea    Sleep study done at [facility] in [facility]- no cpap     Past Surgical History:  Procedure Laterality Date  . CHOLECYSTECTOMY  01/18/2011   Procedure: LAPAROSCOPIC CHOLECYSTECTOMY WITH INTRAOPERATIVE CHOLANGIOGRAM;  Surgeon: Odis Hollingshead, MD;  Location: [facility] OR;  Service: General;  Laterality: N/A;  . COLONOSCOPY  2006, 2-3 years ago   had issues with bleeding  . POLYPECTOMY    .  TONSILLECTOMY       reports that he has quit smoking. He quit after 5.00 years of use. He has never used smokeless tobacco. He reports current alcohol use of about 6.0 standard drinks of alcohol per week. He reports that he does not use drugs.  Allergies  Allergen Reactions  . Penicillins Hives    Did it involve swelling of the face/tongue/throat, SOB, or low BP? Yes Did it involve sudden or severe rash/hives, skin peeling, or any reaction on the inside of your mouth or nose? No Did you need to seek medical attention at a hospital or doctor's office? No When did it last happen?recent If all above answers are "NO", may proceed with cephalosporin use.     Family History  Problem Relation Age of Onset  . Breast cancer Mother   . Bladder Cancer Mother   . Lung cancer Mother   . Colon cancer Neg Hx   . Pancreatic cancer Neg Hx   . Stomach cancer Neg Hx   . Esophageal cancer Neg Hx   . Diabetes Neg Hx   . Heart disease Neg Hx   . Rectal cancer Neg Hx   . Colon polyps Neg Hx     Prior to Admission medications   Medication Sig Start Date End Date Taking? Authorizing Provider  atorvastatin (LIPITOR) 40 MG tablet Take 40  mg by mouth 2 (two) times a week.    Yes [provider]  telmisartan-hydrochlorothiazide (MICARDIS HCT) 80-12.5 MG tablet Take 1 tablet by mouth daily. 08/19/18  Yes [provider]    Physical Exam: Vitals:   09/27/18 1243 09/27/18 1330 09/27/18 1430  BP: (!) 143/77 (!) 134/92 (!) 146/83  Pulse: 65 73 72  Resp: 16  16  Temp: 97.7 F (36.5 C)    TempSrc: Oral    SpO2: 100% 98% 100%    Constitutional: 68 y.o. male NAD, calm, comfortable Vitals:   09/27/18 1243 09/27/18 1330 09/27/18 1430  BP: (!) 143/77 (!) 134/92 (!) 146/83  Pulse: 65 73 72  Resp: 16  16  Temp: 97.7 F (36.5 C)    TempSrc: Oral    SpO2: 100% 98% 100%   General: 68 y.o. male resting in bed in NAD Eyes: PERRL, normal sclera ENMT: Nares patent w/o discharge,  orophaynx clear, dentition normal, ears w/o discharge/lesions/ulcers Cardiovascular: RRR, +S1, S2, no m/g/r, equal pulses throughout Respiratory: CTABL, no w/r/r, normal WOB GI: BS+, NDNT, no masses noted, no organomegaly noted MSK: No e/c/c Skin: No rashes, bruises, ulcerations noted Neuro: A&O x 3, no focal deficits Psyc: Appropriate interaction and affect, calm/cooperative  Labs on Admission: I have personally reviewed following labs and imaging studies  CBC: Recent Labs  Lab 09/27/18 1322  WBC 5.5  NEUTROABS 3.4  HGB 15.3  HCT 45.6  MCV 91.4  PLT 123456   Basic Metabolic Panel: Recent Labs  Lab 09/27/18 1322  NA 137  K 4.0  CL 104  CO2 26  GLUCOSE 98  BUN 18  CREATININE 1.09  CALCIUM 9.3   GFR: Estimated Creatinine Clearance: 66.9 mL/min (by C-G formula based on SCr of 1.09 mg/dL). Liver Function Tests: No results for input(s): AST, ALT, ALKPHOS, BILITOT, PROT, ALBUMIN in the last 168 hours. No results for input(s): LIPASE, AMYLASE in the last 168 hours. No results for input(s): AMMONIA in the last 168 hours. Coagulation Profile: Recent Labs  Lab 09/27/18 1322  INR 0.9   Cardiac Enzymes: No results for input(s): CKTOTAL, CKMB, CKMBINDEX, TROPONINI in the last 168 hours. BNP (last 3 results) No results for input(s): PROBNP in the last 8760 hours. HbA1C: No results for input(s): HGBA1C in the last 72 hours. CBG: No results for input(s): GLUCAP in the last 168 hours. Lipid Profile: No results for input(s): CHOL, HDL, LDLCALC, TRIG, CHOLHDL, LDLDIRECT in the last 72 hours. Thyroid Function Tests: No results for input(s): TSH, T4TOTAL, FREET4, T3FREE, THYROIDAB in the last 72 hours. Anemia Panel: No results for input(s): VITAMINB12, FOLATE, FERRITIN, TIBC, IRON, RETICCTPCT in the last 72 hours. Urine analysis:    Component Value Date/Time   COLORURINE YELLOW 11/11/2010 1834   APPEARANCEUR TURBID (A) 11/11/2010 1834   LABSPEC 1.013 11/11/2010 1834    PHURINE 8.0 11/11/2010 1834   GLUCOSEU NEGATIVE 11/11/2010 1834   HGBUR NEGATIVE 11/11/2010 1834   BILIRUBINUR NEGATIVE 11/11/2010 1834   KETONESUR NEGATIVE 11/11/2010 1834   PROTEINUR NEGATIVE 11/11/2010 1834   UROBILINOGEN 0.2 11/11/2010 1834   NITRITE NEGATIVE 11/11/2010 1834   LEUKOCYTESUR NEGATIVE 11/11/2010 1834    Radiological Exams on Admission: No results found.  Assessment/Plan Principal Problem:   GIB (gastrointestinal bleeding) Active Problems:   Essential hypertension   Factor IX deficiency (HCC)   GIB Factor IX deficiency     - recent C-scope with polypectomy     - bloody stools yesterday and today     -  spoke with GI, rec'd follow up c-scope tomorrow; prep tonight     - concern for ongoing bleed; needs obs admit     - follow hgb     - CLD for now  HTN     - resume home meds after procedure     - PRN hydralazine  DVT prophylaxis: SCDs  Code Status: FULL  Family Communication: None at bedside  Disposition Plan: TBD  Consults called: GI called by ED  Admission status: Observation.     Jonnie Finner DO [facility] Pager [phone]  If 7PM-7AM, please contact night-coverage [email or web address] Password [facility]  09/27/2018, 4:46 PM

## 2018-09-27 NOTE — ED Notes (Signed)
ED TO INPATIENT HANDOFF REPORT  Name/Age/Gender David Chen 68 y.o. male  Code Status Code Status History    Date Active Date Inactive Code Status Order ID Comments User Context   01/18/2011 1008 01/19/2011 1506 Full Code [id number]  Marlena Clipper, RN Inpatient   Advance Care Planning Activity    Advance Directive Documentation     Most Recent Value  Type of Advance Directive  Living will, Healthcare Power of Attorney  Pre-existing out of facility DNR order (yellow form or pink MOST form)  -  "MOST" Form in Place?  -      Home/SNF/Other Home  Chief Complaint post procedure bleeding  Level of Care/Admitting Diagnosis ED Disposition    ED Disposition Condition [place]: [place] [[id number]]  Level of Care: Telemetry [5]  Admit to tele based on following criteria: Other see comments  Comments: active bleed  Covid Evaluation: Asymptomatic Screening Protocol (No Symptoms)  Diagnosis: GIB (gastrointestinal bleeding) [494496]  Admitting Physician: Jonnie Finner [[id number]]  Attending Physician: Jonnie Finner [[id number]]  PT Class (Do Not Modify): Observation [104]  PT Acc Code (Do Not Modify): Observation [10022]       Medical History Past Medical History:  Diagnosis Date  . Arthritis    lower back but not diagnosed per pt  . Asthma   . Clotting disorder [facility])    Managed by Dr. Alen Blew will see him Thursday for a Transfusion, unsure type not been diagnosed  . Diverticulosis   . 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 High cholesterol   . Hx of adenomatous colonic polyps   . Hypertension    does not see a cardiologist  . Inguinal hernia    on the right side- not causing issues , no pain per pt 09-11-2018  . Sleep apnea    Sleep study done at [place] in [facility]- no cpap     Allergies Allergies  Allergen Reactions  . Penicillins Hives    Did it involve swelling of the face/tongue/throat, SOB, or low BP?  Yes Did it involve sudden or severe rash/hives, skin peeling, or any reaction on the inside of your mouth or nose? No Did you need to seek medical attention at a hospital or doctor's office? No When did it last happen?recent If all above answers are "NO", may proceed with cephalosporin use.     IV Location/Drains/Wounds Patient Lines/Drains/Airways Status   Active Line/Drains/Airways    Name:   Placement date:   Placement time:   Site:   Days:   Peripheral IV 09/27/18 Right Antecubital   09/27/18    1322    Antecubital   less than 1   Incision 01/18/11 Abdomen Other (Comment)   01/18/11    0826     2809   Incision - 4 Ports Abdomen 1: Umbilicus 2: Right;Upper 3: Right;Lower 4: Mid;Upper   01/18/11    -     2809          Labs/Imaging Results for orders placed or performed during the hospital encounter of 09/27/18 (from the past 48 hour(s))  CBC with Differential     Status: None   Collection Time: 09/27/18  1:22 PM  Result Value Ref Range   WBC 5.5 4.0 - 10.5 K/uL   RBC 4.99 4.22 - 5.81 MIL/uL   Hemoglobin 15.3 13.0 - 17.0 g/dL   HCT 45.6 39.0 - 52.0 %   MCV  91.4 80.0 - 100.0 fL   MCH 30.7 26.0 - 34.0 pg   MCHC 33.6 30.0 - 36.0 g/dL   RDW 12.5 11.5 - 15.5 %   Platelets 256 150 - 400 K/uL   nRBC 0.0 0.0 - 0.2 %   Neutrophils Relative % 62 %   Neutro Abs 3.4 1.7 - 7.7 K/uL   Lymphocytes Relative 26 %   Lymphs Abs 1.4 0.7 - 4.0 K/uL   Monocytes Relative 10 %   Monocytes Absolute 0.5 0.1 - 1.0 K/uL   Eosinophils Relative 1 %   Eosinophils Absolute 0.1 0.0 - 0.5 K/uL   Basophils Relative 1 %   Basophils Absolute 0.0 0.0 - 0.1 K/uL   Immature Granulocytes 0 %   Abs Immature Granulocytes 0.01 0.00 - 0.07 K/uL    Comment: Performed at [facility], [place] [address] [place], [place] [address]  Basic metabolic panel     Status: None   Collection Time: 09/27/18  1:22 PM  Result Value Ref Range   Sodium 137 135 - 145 mmol/L   Potassium 4.0 3.5 - 5.1  mmol/L   Chloride 104 98 - 111 mmol/L   CO2 26 22 - 32 mmol/L   Glucose, Bld 98 70 - 99 mg/dL   BUN 18 8 - 23 mg/dL   Creatinine, Ser 1.09 0.61 - 1.24 mg/dL   Calcium 9.3 8.9 - 10.3 mg/dL   GFR calc non Af Amer >60 >60 mL/min   GFR calc Af Amer >60 >60 mL/min   Anion gap 7 5 - 15    Comment: Performed at [facility], [place] [address], [place], [place] [address]  Protime-INR     Status: None   Collection Time: 09/27/18  1:22 PM  Result Value Ref Range   Prothrombin Time 12.1 11.4 - 15.2 seconds   INR 0.9 0.8 - 1.2    Comment: (NOTE) INR goal varies based on device and disease states. Performed at [facility], [place] [address], [place], [place] [address]    No results found.  Pending Labs Unresulted Labs (From admission, onward)    Start     Ordered   09/28/18 0500  CBC  Tomorrow morning,   R     09/27/18 1524   09/28/18 0500  Protime-INR  Tomorrow morning,   R     09/27/18 1524   09/27/18 1643  SARS CORONAVIRUS 2 (TAT 6-24 HRS) Nasopharyngeal Nasopharyngeal Swab  (Asymptomatic/Tier 2 Patients Labs)  Once,   STAT    Question Answer Comment  Is this test for diagnosis or screening Screening   Symptomatic for COVID-19 as defined by CDC No   Hospitalized for COVID-19 No   Admitted to ICU for COVID-19 No   Previously tested for COVID-19 No   Resident in a congregate (group) care setting No   Employed in healthcare setting No      09/27/18 1644   Signed and Held  HIV antibody (Routine Testing)  Once,   R     Signed and Held   Signed and Held  CBC  Tomorrow morning,   R     Signed and Held   Signed and Held  Comprehensive metabolic panel  Tomorrow morning,   R     Signed and Held   Signed and Held  HEMOGLOBIN AND HEMATOCRIT, BLOOD  Now then every 8 hours,   R     Signed and Held  Vitals/Pain Today's Vitals   09/27/18 1243 09/27/18 1330 09/27/18 1430 09/27/18 1700  BP: (!) 143/77 (!) 134/92 (!) 146/83 (!) 136/96  Pulse: 65  73 72 70  Resp: 16  16   Temp: 97.7 F (36.5 C)     TempSrc: Oral     SpO2: 100% 98% 100% 98%  PainSc: 0-No pain       Isolation Precautions No active isolations  Medications Medications  peg 3350 powder (MOVIPREP) kit 100 g (has no administration in time range)  peg 3350 powder (MOVIPREP) kit 100 g (has no administration in time range)  bisacodyl (DULCOLAX) EC tablet 10 mg (has no administration in time range)  bisacodyl (DULCOLAX) EC tablet 10 mg (has no administration in time range)    Mobility walks

## 2018-09-27 NOTE — ED Triage Notes (Signed)
Patient reports dark red blood in stool x2 since colonoscopy last week. Hx factor IX deficiency. Hx of same. Denies weakness.

## 2018-09-27 NOTE — Telephone Encounter (Signed)
Patient called the answering service.  I called him back. On Friday, 9/18 patient had colonoscopy with polypectomy.  He has a factor IX deficiency. Yesterday, Saturday he had his first bowel movement, it was stool with blood.  He had a second bowel movement since the colonoscopy this morning and there was bowel movement with a larger amount of blood. Patient had post polypectomy bleed that required hemostatic therapy, cauterization several years ago so he is familiar with post polypectomy bleeding. Patient feels fine, no pain, no dizziness.  Advised him to come to [facility] to either [facility] or [facility] long hospital.  He says that he will be coming up to [facility].  He asked if he could eat and I said he would it would be okay if he had clear liquids this morning.  Although I did not endorse it, the patient wanted to have a shower before he left home.  Expect his arrival in the [place] long ED within the next few hours.  Patient is aware that he is probably going to need to do another bowel prep and have colonoscopy tomorrow.

## 2018-09-27 NOTE — Consult Note (Signed)
Gastroenterology Inpatient Consultation   Attending Requesting Consult Wyvonnia Dusky, [place] [facility] Day: 1  Reason for Consult Rectal bleeding post colonoscopy query post polypectomy bleeding   History of Present Illness  David Chen is a 68 y.o. male with a pmh significant for factor IX deficiency, arthritis, diverticulosis, hypertension, inguinal hernia, sleep apnea, colon polyps (previous bleeding on at least one other colonoscopy with polypectomy).  The GI service is consulted for evaluation and management of rectal bleeding post colonoscopy with polypectomy.  The patient underwent a surveillance colonoscopy on 9/18 with Dr. Loletha Carrow.  1 proximal transverse colon polyp was hot snare resected.  The patient did well post procedure and was discharged.  Approximately 24 to 30 hours later he began to have episodes of bleeding which were dark maroon in color.  This continued into today and he called the answering service and was referred by my PA to the emergency department for further evaluation.  Hemoglobin has been checked and it is stable without overt anemia.  He had another episode prior to coming into the ED.  No abdominal pain.  No nausea or vomiting.  He did not receive any factor replacement prior to his colonoscopy.  GI Review of Systems Positive as above Negative for melena, bloating, pain   Problem List   Patient Active Problem List   Diagnosis Date Noted  . Personal history of colonic polyps 07/12/2013  . 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 Epigastric abdominal pain 11/21/2010  . HYPERTENSION 01/27/2008  . HEMORRHOIDS-EXTERNAL 01/27/2008  . RECTAL BLEEDING 01/27/2008  . LIVER FUNCTION TESTS, ABNORMAL, HX OF 01/27/2008  . HEMORRHOIDS, INTERNAL 09/14/2004     Histories  Past Medical History Past Medical History:  Diagnosis Date  . Arthritis    lower back but not diagnosed per pt  . Asthma   . Clotting disorder [facility])    Managed by Dr. Alen Blew  will see him Thursday for a Transfusion, unsure type not been diagnosed  . Diverticulosis   . 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 High cholesterol   . Hx of adenomatous colonic polyps   . Hypertension    does not see a cardiologist  . Inguinal hernia    on the right side- not causing issues , no pain per pt 09-11-2018  . Sleep apnea    Sleep study done at [place] in [employer]- no cpap    Past Surgical History:  Procedure Laterality Date  . CHOLECYSTECTOMY  01/18/2011   Procedure: LAPAROSCOPIC CHOLECYSTECTOMY WITH INTRAOPERATIVE CHOLANGIOGRAM;  Surgeon: Odis Hollingshead, MD;  Location: [facility] OR;  Service: General;  Laterality: N/A;  . COLONOSCOPY  2006, 2-3 years ago   had issues with bleeding  . POLYPECTOMY    . TONSILLECTOMY      Allergies Allergies  Allergen Reactions  . Penicillins Hives    Did it involve swelling of the face/tongue/throat, SOB, or low BP? Yes Did it involve sudden or severe rash/hives, skin peeling, or any reaction on the inside of your mouth or nose? No Did you need to seek medical attention at a hospital or doctor's office? No When did it last happen?recent If all above answers are "NO", may proceed with cephalosporin use.     Family History Family History  Problem Relation Age of Onset  . Breast cancer Mother   . Bladder Cancer Mother   . Lung cancer Mother   . Colon cancer Neg Hx   . Pancreatic  cancer Neg Hx   . Stomach cancer Neg Hx   . Esophageal cancer Neg Hx   . Diabetes Neg Hx   . Heart disease Neg Hx   . Rectal cancer Neg Hx   . Colon polyps Neg Hx     Social History Social History   Socioeconomic History  . Marital status: Married    Spouse name: Not on file  . Number of children: Not on file  . Years of education: Not on file  . Highest education level: Not on file  Occupational History  . Not on file  Social Needs  . Financial resource strain: Not on file  . Food insecurity    Worry: Not on file     Inability: Not on file  . Transportation needs    Medical: Not on file    Non-medical: Not on file  Tobacco Use  . Smoking status: Former Smoker    Years: 5.00  . Smokeless tobacco: Never Used  Substance and Sexual Activity  . Alcohol use: Yes    Alcohol/week: 6.0 standard drinks    Types: 6 Glasses of wine per week  . Drug use: No  . Sexual activity: Not on file    Comment: quit smoking in early 20's, smoked ~ 1pk/ week  Lifestyle  . Physical activity    Days per week: Not on file    Minutes per session: Not on file  . Stress: Not on file  Relationships  . Social Herbalist on phone: Not on file    Gets together: Not on file    Attends religious service: Not on file    Active member of club or organization: Not on file    Attends meetings of clubs or organizations: Not on file    Relationship status: Not on file  . Intimate partner violence    Fear of current or ex partner: Not on file    Emotionally abused: Not on file    Physically abused: Not on file    Forced sexual activity: Not on file  Other Topics Concern  . Not on file  Social History Narrative  . Not on file    Medications  Home Medications No current facility-administered medications on file prior to encounter.    Current Outpatient Medications on File Prior to Encounter  Medication Sig Dispense Refill  . atorvastatin (LIPITOR) 40 MG tablet Take 40 mg by mouth 2 (two) times a week.     . telmisartan-hydrochlorothiazide (MICARDIS HCT) 80-12.5 MG tablet Take 1 tablet by mouth daily.     Scheduled Inpatient Medications . bisacodyl  10 mg Oral Once  . bisacodyl  10 mg Oral Once  . peg 3350 powder  0.5 kit Oral Once  . [START ON 09/28/2018] peg 3350 powder  0.5 kit Oral Once   Continuous Inpatient Infusions  PRN Inpatient Medications   Review of Systems  General: Denies fevers/chills HEENT: Denies oral lesions Cardiovascular: Denies chest pain Pulmonary: Denies shortness of breath  Gastroenterological: See HPI Genitourinary: Denies darkened urine or hematuria Hematological: History of easy bruising/bleeding Dermatological: Denies jaundice Psychological: Mood is anxious   Physical Examination  BP (!) 143/77   Pulse 65   Temp 97.7 F (36.5 C) (Oral)   Resp 16   SpO2 100%  GEN: NAD, appears stated age, doesn't appear chronically ill PSYCH: Cooperative, without pressured speech EYE: Conjunctivae pink, sclerae anicteric ENT: MMM, without oral ulcers, no erythema or exudates noted CV: Non-tachycardic RESP:  Without audible wheezing GI: NABS, soft, mildly protuberant, NT/ND, without rebound or guarding, no HSM appreciated GU: DRE deferred MSK/EXT: No lower extremity edema SKIN: No jaundice NEURO:  Alert & Oriented x 3, no focal deficits   Review of Data  I reviewed the following data at the time of this encounter:  Laboratory Studies   Recent Labs  Lab 09/27/18 1322  NA 137  K 4.0  CL 104  CO2 26  BUN 18  CREATININE 1.09  GLUCOSE 98  CALCIUM 9.3   No results for input(s): AST, ALT, GGT, ALKPHOS in the last 168 hours.  Invalid input(s): TBILI, CONJBILI, ALB  Recent Labs  Lab 09/27/18 1322  WBC 5.5  HGB 15.3  HCT 45.6  PLT 256   Recent Labs  Lab 09/27/18 1322  INR 0.9   Imaging Studies  No new relevant studies to review  GI Procedures and Studies  09/25/2018 colonoscopy - One 8 mm polyp in the proximal transverse colon, removed with a hot snare. Resected and retrieved. - Diverticulosis in the left colon. - The examination was otherwise normal on direct and retroflexion views.   Assessment  Mr. Gloster is a 68 y.o. male with a pmh significant for factor IX deficiency, arthritis, diverticulosis, hypertension, inguinal hernia, sleep apnea, colon polyps (previous bleeding on at least one other colonoscopy with polypectomy).  The GI service is consulted for evaluation and management of rectal bleeding post colonoscopy with polypectomy.   The patient is hemodynamically stable.  Patient's recent colonoscopy with hot snare polypectomy is the most likely source of bleeding.  It is not clear if the patient is still bleeding or he could potentially stop.  However due to his factor deficiency he is at risk of rebleeding.  I think it is worthwhile to monitor and observe the patient overnight into tomorrow and begin a bowel preparation in the possible utility of repeating a colonoscopy to evaluate the proximal transverse colon resection site.  He is at risk of having a diverticular bleed as he has diverticulosis but it would be exceedingly unlikely for the patient to have a separate process occurring.  Patient wanted to potentially have a preparation at home to come in for an inpatient procedure however that is not possible.  I do not anticipate that the patient will require blood transfusion and if during his preparation he cleans out and there is up they may not require a colonoscopy but we will save the time slot for tomorrow in the afternoon at which point a colonoscopy can be pursued if necessary.  Dr. Loletha Carrow takes over the service tomorrow and he will see him in follow-up.  The risks and benefits of endoscopic evaluation were discussed with the patient; these include but are not limited to the risk of perforation, infection, bleeding, missed lesions, lack of diagnosis, severe illness requiring hospitalization, as well as anesthesia and sedation related illnesses.  The patient is agreeable to proceed if necessary.   Plan/Recommendations  MoviPrep ordered Dulcolax ordered CBCs to be trended by primary medical service Medicine service should reach out to oncology/hematology about whether a factor IX infusion may be beneficial for the patient and that can be discussed Hold Heparin/VTE PPx Reevaluation in the morning to consider colonoscopy versus supportive monitor Observation very reasonable   Thank you for this consult.  We will continue to  follow.  Please page/call with questions or concerns.   Justice Britain, MD [facility] Advanced Endoscopy Office # [phone]

## 2018-09-27 NOTE — ED Provider Notes (Signed)
[facility] DEPT Provider Note   CSN: [id number] Arrival date & time: 09/27/18  1226     History   Chief Complaint Chief Complaint  Patient presents with  . Melena    HPI David Chen is a 68 y.o. male past medical history of factor IX deficiency, diverticulosis, who recently underwent colonoscopy 2 days ago with 8 mm polyp removal, presented to emergency department with blood and melena in his stool.  The patient reports on the day following his colonoscopy he had a large bowel movement which "filled the toilet bowl" with dark red blood.  He reports he subsequently had 1 bowel movement yesterday and an additional bowel movement today, both of which continued to have "large" amounts of blood in the toilet bowl when he wipes.  He cannot quantify how much blood other than the state that "the water is all red".  He denies feeling lightheaded, short of breath, dizzy, or weak.  He denies any fevers or chills.  He called his GI office today and was referred into the emergency department by David Freed PA, who wrote, " Patient is aware that he is probably going to need to do another bowel prep and have colonoscopy tomorrow."    HPI  Past Medical History:  Diagnosis Date  . Arthritis    lower back but not diagnosed per pt  . Asthma   . Clotting disorder [facility])    Managed by Dr. Alen Blew will see him Thursday for a Transfusion, unsure type not been diagnosed  . Diverticulosis   . Factor IX deficiency ([place])   . High cholesterol   . Hx of adenomatous colonic polyps   . Hypertension    does not see a cardiologist  . Inguinal hernia    on the right side- not causing issues , no pain per pt 09-11-2018  . Sleep apnea    Sleep study done at [facility]- no cpap     Patient Active Problem List   Diagnosis Date Noted  . Personal history of colonic polyps 07/12/2013  . Factor IX deficiency ([place]) 02/12/2011  . Epigastric abdominal pain  11/21/2010  . HYPERTENSION 01/27/2008  . HEMORRHOIDS-EXTERNAL 01/27/2008  . RECTAL BLEEDING 01/27/2008  . LIVER FUNCTION TESTS, ABNORMAL, HX OF 01/27/2008  . HEMORRHOIDS, INTERNAL 09/14/2004    Past Surgical History:  Procedure Laterality Date  . CHOLECYSTECTOMY  01/18/2011   Procedure: LAPAROSCOPIC CHOLECYSTECTOMY WITH INTRAOPERATIVE CHOLANGIOGRAM;  Surgeon: Odis Hollingshead, MD;  Location: [facility] OR;  Service: General;  Laterality: N/A;  . COLONOSCOPY  2006, 2-3 years ago   had issues with bleeding  . POLYPECTOMY    . TONSILLECTOMY          Home Medications    Prior to Admission medications   Medication Sig Start Date End Date Taking? Authorizing Provider  atorvastatin (LIPITOR) 40 MG tablet Take 40 mg by mouth 2 (two) times a week.    Yes [provider]  telmisartan-hydrochlorothiazide (MICARDIS HCT) 80-12.5 MG tablet Take 1 tablet by mouth daily. 08/19/18  Yes [provider]    Family History Family History  Problem Relation Age of Onset  . Breast cancer Mother   . Bladder Cancer Mother   . Lung cancer Mother   . Colon cancer Neg Hx   . Pancreatic cancer Neg Hx   . Stomach cancer Neg Hx   . Esophageal cancer Neg Hx   . Diabetes Neg Hx   . Heart  disease Neg Hx   . Rectal cancer Neg Hx   . Colon polyps Neg Hx     Social History Social History   Tobacco Use  . Smoking status: Former Smoker    Years: 5.00  . Smokeless tobacco: Never Used  Substance Use Topics  . Alcohol use: Yes    Alcohol/week: 6.0 standard drinks    Types: 6 Glasses of wine per week  . Drug use: No     Allergies   Penicillins   Review of Systems Review of Systems  Constitutional: Negative for chills and fever.  Eyes: Negative for photophobia and visual disturbance.  Respiratory: Negative for cough and shortness of breath.   Cardiovascular: Negative for chest pain and palpitations.  Gastrointestinal: Positive for blood in stool. Negative for abdominal pain,  constipation, diarrhea, nausea and vomiting.  Skin: Negative for pallor and rash.  Neurological: Negative for dizziness, syncope and light-headedness.  Psychiatric/Behavioral: Negative for agitation and confusion.  All other systems reviewed and are negative.    Physical Exam Updated Vital Signs BP (!) 143/77   Pulse 65   Temp 97.7 F (36.5 C) (Oral)   Resp 16   SpO2 100%   Physical Exam Vitals signs and nursing note reviewed.  Constitutional:      Appearance: He is well-developed.  HENT:     Head: Normocephalic and atraumatic.  Eyes:     Conjunctiva/sclera: Conjunctivae normal.  Neck:     Musculoskeletal: Neck supple.  Cardiovascular:     Rate and Rhythm: Normal rate and regular rhythm.     Pulses: Normal pulses.  Pulmonary:     Effort: Pulmonary effort is normal. No respiratory distress.     Breath sounds: Normal breath sounds.  Abdominal:     General: There is no distension.     Palpations: Abdomen is soft. There is no mass.     Tenderness: There is no abdominal tenderness.     Hernia: No hernia is present.  Skin:    General: Skin is warm and dry.     Coloration: Skin is not pale.  Neurological:     General: No focal deficit present.     Mental Status: He is alert and oriented to person, place, and time.  Psychiatric:        Mood and Affect: Mood normal.        Behavior: Behavior normal.      ED Treatments / Results  Labs (all labs ordered are listed, but only abnormal results are displayed) Labs Reviewed  CBC WITH DIFFERENTIAL/PLATELET  BASIC METABOLIC PANEL  PROTIME-INR    EKG None  Radiology No results found.  Procedures Procedures (including critical care time)  Medications Ordered in ED Medications - No data to display   Initial Impression / Assessment and Plan / ED Course  I have reviewed the triage vital signs and the nursing notes.  Pertinent labs & imaging results that were available during my care of the patient were reviewed by  me and considered in my medical decision making (see chart for details).  69 year old gentleman with a history of factor IX deficiency presenting with bloody bowel movements consistently for 2 days following a colonoscopy with polyp removal.  He is otherwise asymptomatic and demonstrates no signs of acute blood loss anemia anemia.  He is not tachycardic and has no pallor on exam.  He has no abdominal tenderness.  He is not actively having bleeding.  He was referred into the ED by gastroenterology PA  for possible admission, with bowel prep and may be repeat colonoscopy tomorrow.  We will check his hemoglobin and basic labs and I will reach out to gastroenterology.   Clinical Course as of Sep 27 1451  Sun Sep 27, 2018  1415 Spoke to Dr. Allison Quarry Roddy will be our gastroenterology.  We reviewed the patient's vital signs and presentations and hemoglobin.  I explained to the patient is a very strong preference to do the bowel prep at home and return in the morning, however Dr. Allison Quarry tells me that in his medical opinion the patient should stay in observation tonight.  I explained this to the patient who is amenable to staying overnight.  We will admit.   [MT]  1450 Spoke to the hospitalist will admit the patient to observation.   [MT]    Clinical Course User Index [MT] Kisha Messman, Carola Rhine, MD     Final Clinical Impressions(s) / ED Diagnoses   Final diagnoses:  Melena    ED Discharge Orders    None       Wyvonnia Dusky, MD 09/27/18 1454

## 2018-09-28 ENCOUNTER — Encounter (HOSPITAL_COMMUNITY): Payer: Self-pay | Admitting: *Deleted

## 2018-09-28 DIAGNOSIS — K922 Gastrointestinal hemorrhage, unspecified: Secondary | ICD-10-CM | POA: Diagnosis not present

## 2018-09-28 DIAGNOSIS — D67 Hereditary factor IX deficiency: Secondary | ICD-10-CM | POA: Diagnosis not present

## 2018-09-28 DIAGNOSIS — I10 Essential (primary) hypertension: Secondary | ICD-10-CM | POA: Diagnosis not present

## 2018-09-28 LAB — CBC
HCT: 43.7 % (ref 39.0–52.0)
Hemoglobin: 14.3 g/dL (ref 13.0–17.0)
MCH: 30.1 pg (ref 26.0–34.0)
MCHC: 32.7 g/dL (ref 30.0–36.0)
MCV: 92 fL (ref 80.0–100.0)
Platelets: 244 10*3/uL (ref 150–400)
RBC: 4.75 MIL/uL (ref 4.22–5.81)
RDW: 12.5 % (ref 11.5–15.5)
WBC: 5.7 10*3/uL (ref 4.0–10.5)
nRBC: 0 % (ref 0.0–0.2)

## 2018-09-28 LAB — PROTIME-INR
INR: 1 (ref 0.8–1.2)
Prothrombin Time: 13.2 seconds (ref 11.4–15.2)

## 2018-09-28 LAB — HEMOGLOBIN AND HEMATOCRIT, BLOOD
HCT: 45.1 % (ref 39.0–52.0)
Hemoglobin: 14.5 g/dL (ref 13.0–17.0)

## 2018-09-28 LAB — COMPREHENSIVE METABOLIC PANEL
ALT: 67 U/L — ABNORMAL HIGH (ref 0–44)
AST: 64 U/L — ABNORMAL HIGH (ref 15–41)
Albumin: 4.3 g/dL (ref 3.5–5.0)
Alkaline Phosphatase: 58 U/L (ref 38–126)
Anion gap: 9 (ref 5–15)
BUN: 16 mg/dL (ref 8–23)
CO2: 22 mmol/L (ref 22–32)
Calcium: 9.2 mg/dL (ref 8.9–10.3)
Chloride: 111 mmol/L (ref 98–111)
Creatinine, Ser: 1.16 mg/dL (ref 0.61–1.24)
GFR calc Af Amer: >60 mL/min (ref >60)
GFR calc non Af Amer: >60 mL/min (ref >60)
Glucose, Bld: 107 mg/dL — ABNORMAL HIGH (ref 70–99)
Potassium: 3.8 mmol/L (ref 3.5–5.1)
Sodium: 142 mmol/L (ref 135–145)
Total Bilirubin: 2.7 mg/dL — ABNORMAL HIGH (ref 0.3–1.2)
Total Protein: 6.5 g/dL (ref 6.5–8.1)

## 2018-09-28 LAB — HIV ANTIBODY (ROUTINE TESTING W REFLEX): HIV Screen 4th Generation wRfx: NONREACTIVE

## 2018-09-28 LAB — SARS CORONAVIRUS 2 (TAT 6-24 HRS): SARS Coronavirus 2: NEGATIVE

## 2018-09-28 NOTE — Progress Notes (Signed)
Pts IV removed with a clean and dry dressing intact. Pt denies pain at the time of d/c with no s/s of distress noted. Pt educated on d/c instructions, followups, and medications, with all questions at the time answered.

## 2018-09-28 NOTE — Progress Notes (Addendum)
Progress Note   Subjective  Chief Complaint: GI bleed  This morning, the patient tells me that he stopped seeing bright red blood when he was using the prep after his third bowel movement.  Which is around 8 or 9:00 last night.  Since then he has had multiple frequent bowel movements all which are clear, he is left the last one in the toilet for me to see.  There is no blood.  Tells me that if he does not need a repeat colonoscopy he is happy with that, but would like to figure out what is going on before he sits here all day.   Objective   Vital signs in last 24 hours: Temp:  [97.7 F (36.5 C)-98.4 F (36.9 C)] 98.2 F (36.8 C) (09/21 0531) Pulse Rate:  [61-73] 61 (09/21 0531) Resp:  [14-18] 18 (09/21 0531) BP: (122-148)/(77-96) 122/79 (09/21 0531) SpO2:  [98 %-100 %] 99 % (09/21 0531) Last BM Date: 09/27/18 General:    male in NAD Heart:  Regular rate and rhythm; no murmurs Lungs: Respirations even and unlabored, lungs CTA bilaterally Abdomen:  Soft, nontender and nondistended. Normal bowel sounds. Extremities:  Without edema. Neurologic:  Alert and oriented,  grossly normal neurologically. Psych:  Cooperative. Normal mood and affect.   Lab Results: Recent Labs    09/27/18 1322 09/27/18 1941 09/28/18 0344  WBC 5.5  --  5.7  HGB 15.3 15.0 14.3  HCT 45.6 45.3 43.7  PLT 256  --  244   BMET Recent Labs    09/27/18 1322 09/28/18 0344  NA 137 142  K 4.0 3.8  CL 104 111  CO2 26 22  GLUCOSE 98 107*  BUN 18 16  CREATININE 1.09 1.16  CALCIUM 9.3 9.2   LFT Recent Labs    09/28/18 0344  PROT 6.5  ALBUMIN 4.3  AST 64*  ALT 67*  ALKPHOS 58  BILITOT 2.7*   PT/INR Recent Labs    09/27/18 1322 09/28/18 0344  LABPROT 12.1 13.2  INR 0.9 1.0     Assessment / Plan:   Assessment: 1.  GI bleed: Colonoscopy with hot snare polypectomy 09/25/2018 by Dr. Loletha Carrow, 8 mm polyp removed from the proximal transverse colon, patient started bleeding and does have a  factorIX deficiency, admitted 09/27/2018, started prep last night and has not had any more blood since 8 or 9 PM, now "running clear", hemoglobin from 15.3-14.3 since admission; most likely post polypectomy bleed which is now stopped  Plan: 1.  Will discuss with Dr. Loletha Carrow.  Most likely we will not proceed with colonoscopy today.  Patient will likely be started on clear liquids with gradual increase in diet and possibly discharge later today pending repeat hemoglobins 2.  Discussed with the patient I would let him know as soon as I knew exactly what was happening. 3.  Please await any further recommendations from Dr. Loletha Carrow.  [id number]: Spoke with Dr. Loletha Carrow.  Plans are to allow patient's discharge this morning.  He can be on a regular diet.  He is aware that if he sees any further bleeding he needs to return to the hospital.  Patient asked about work tomorrow, I explained that this would be perfectly fine, but again recommended that he watch his stools.  With any further bleeding he is aware he needs to return to the ER.  Patient eager to get out of here before 2/3 o'clock this afternoon.  We will continue to follow while admitted. Thank  you for your kind consultation.   LOS: 0 days   Levin Erp  09/28/2018, 9:25 AM  ________________________________________________________________________  Velora Heckler GI MD note:  I personally examined the patient, reviewed the data and agree with the assessment and plan described above.  Bleeding has clearly stopped.  He understands that the risk of rebleeding is low, certainly after he is about 2 weeks out from the polypectomy with cautery.  He will keep a close eye on his stools and knows to call our office if bleeding resumes.  Ok to d/c today.  Please call or page with any further questions or concerns.    Owens Loffler, MD [facility] Gastroenterology Pager [phone]

## 2018-09-28 NOTE — Discharge Summary (Signed)
Physician Discharge Summary  KABIR BORELL [id number] DOB: 1950/09/25 DOA: 09/27/2018  PCP: Emmaline Kluver, MD  Admit date: 09/27/2018 Discharge date: 09/28/2018  Admitted From: Home  disposition: Home Recommendations for Outpatient Follow-up:  1. Follow up with PCP in 1-2 weeks 2. Please obtain BMP/CBC in one week 3. Follow-up with Dr. Julieanne Manson none  equipment/Devices: None Discharge Condition stable  CODE STATUS: Full code Diet recommendation: Cardiac  Brief/Interim Summary:68 y.o. male with medical history significant of Factor IX deficiency. Presents with bloody stools. Reports colonoscopy with polypectomy 2 days ago. He was fine until yesterday when his noticed a dark stool with blood. I thought it would pass; however when he had a bowel movement this morning, there was more blood. He talked to his GI doc and came to the ED. He denies any aggravating or alleviating factors. No other associated symptoms. He notes is bleed only happening when he has a BM.   ED Course: D/w GI. Concern for ongoing bleed. Requested admission for Hgb monitor and c-scope in AM. [facility] called for admission.   Discharge Diagnoses:  Principal Problem:   GIB (gastrointestinal bleeding) Active Problems:   Essential hypertension   Factor IX deficiency ([place])   Post polypectomy GI bleed in the setting of factor IX deficiency-patient took prep overnight for possible repeat colonoscopy since patient did not have any further blood in his stool this was canceled.  He will follow-up with GI Dr. Loletha Carrow.  His hemoglobin on the day of discharge was 14.3 and at the time of admission it was 15.3.  Hypertension continue home medications.  Estimated body mass index is 29.05 kg/m as calculated from the following:   Height as of 09/25/18: 5\' 6"  (1.676 m).   Weight as of 09/25/18: 81.6 kg.  Discharge Instructions  Discharge Instructions    Call MD for:  difficulty breathing, headache or visual  disturbances   Complete by: As directed    Call MD for:  extreme fatigue   Complete by: As directed    Call MD for:  persistant dizziness or light-headedness   Complete by: As directed    Call MD for:  persistant nausea and vomiting   Complete by: As directed    Call MD for:  redness, tenderness, or signs of infection (pain, swelling, redness, odor or green/yellow discharge around incision site)   Complete by: As directed    Call MD for:  severe uncontrolled pain   Complete by: As directed    Diet - low sodium heart healthy   Complete by: As directed    Increase activity slowly   Complete by: As directed      Allergies as of 09/28/2018      Reactions   Penicillins Hives   Did it involve swelling of the face/tongue/throat, SOB, or low BP? Yes Did it involve sudden or severe rash/hives, skin peeling, or any reaction on the inside of your mouth or nose? No Did you need to seek medical attention at a hospital or doctor's office? No When did it last happen?recent If all above answers are "NO", may proceed with cephalosporin use.      Medication List    TAKE these medications   atorvastatin 40 MG tablet Commonly known as: LIPITOR Take 40 mg by mouth 2 (two) times a week.   telmisartan-hydrochlorothiazide 80-12.5 MG tablet Commonly known as: MICARDIS HCT Take 1 tablet by mouth daily.      Follow-up Information    [address] [place]  [place], [place] Follow up.   Specialty: Family Medicine Contact information: [place] [address] [place], [place], [place] Follow up.   Specialty: Gastroenterology Contact information: [place] [address] [place] [address] [phone]          Allergies  Allergen Reactions  . Penicillins Hives    Did it involve swelling of the face/tongue/throat, SOB, or low BP? Yes Did it involve sudden or severe rash/hives, skin peeling, or any reaction on the inside of your mouth or nose? No Did you  need to seek medical attention at a hospital or doctor's office? No When did it last happen?recent If all above answers are "NO", may proceed with cephalosporin use.     Consultations: GI  Procedures/Studies:  No results found. (Echo, Carotid, EGD, Colonoscopy, ERCP)    Subjective: No further bleed denies any chest pain shortness of breath or lightheadedness  Discharge Exam: Vitals:   09/27/18 2105 09/28/18 0531  BP: 126/81 122/79  Pulse: 69 61  Resp: 16 18  Temp: 98.4 F (36.9 C) 98.2 F (36.8 C)  SpO2: 99% 99%   Vitals:   09/27/18 1730 09/27/18 1813 09/27/18 2105 09/28/18 0531  BP: (!) 145/84 (!) 148/77 126/81 122/79  Pulse: 62 71 69 61  Resp: 16 14 16 18   Temp:  98.1 F (36.7 C) 98.4 F (36.9 C) 98.2 F (36.8 C)  TempSrc:   Oral Oral  SpO2: 100% 100% 99% 99%    General: Pt is alert, awake, not in acute distress Cardiovascular: RRR, S1/S2 +, no rubs, no gallops Respiratory: CTA bilaterally, no wheezing, no rhonchi Abdominal: Soft, NT, ND, bowel sounds + Extremities: no edema, no cyanosis    The results of significant diagnostics from this hospitalization (including imaging, microbiology, ancillary and laboratory) are listed below for reference.     Microbiology: Recent Results (from the past 240 hour(s))  SARS CORONAVIRUS 2 (TAT 6-24 HRS) Nasopharyngeal Nasopharyngeal Swab     Status: None   Collection Time: 09/27/18  4:43 PM   Specimen: Nasopharyngeal Swab  Result Value Ref Range Status   SARS Coronavirus 2 NEGATIVE NEGATIVE Final    Comment: (NOTE) SARS-CoV-2 target nucleic acids are NOT DETECTED. The SARS-CoV-2 RNA is generally detectable in upper and lower respiratory specimens during the acute phase of infection. Negative results do not preclude SARS-CoV-2 infection, do not rule out co-infections with other pathogens, and should not be used as the sole basis for treatment or other patient management decisions. Negative results must be  combined with clinical observations, patient history, and epidemiological information. The expected result is Negative. Fact Sheet for Patients: SugarRoll.be Fact Sheet for Healthcare Providers: https://www.woods-.com/ This test is not yet approved or cleared by the [place] FDA and  has been authorized for detection and/or diagnosis of SARS-CoV-2 by FDA under an Emergency Use Authorization (EUA). This EUA will remain  in effect (meaning this test can be used) for the duration of the COVID-19 declaration under Section 56 4(b)(1) of the Act, 21 U.S.C. section 360bbb-3(b)(1), unless the authorization is terminated or revoked sooner. Performed at [facility], [place] [address] [place], [place] [address]      Labs: BNP (last 3 results) No results for input(s): BNP in the last 8760 hours. Basic Metabolic Panel: Recent Labs  Lab 09/27/18 1322 09/28/18 0344  NA 137 142  K 4.0 3.8  CL 104 111  CO2 26 22  GLUCOSE 98 107*  BUN 18 16  CREATININE 1.09 1.16  CALCIUM 9.3 9.2   Liver Function Tests: Recent Labs  Lab 09/28/18 0344  AST 64*  ALT 67*  ALKPHOS 58  BILITOT 2.7*  PROT 6.5  ALBUMIN 4.3   No results for input(s): LIPASE, AMYLASE in the last 168 hours. No results for input(s): AMMONIA in the last 168 hours. CBC: Recent Labs  Lab 09/27/18 1322 09/27/18 1941 09/28/18 0344  WBC 5.5  --  5.7  NEUTROABS 3.4  --   --   HGB 15.3 15.0 14.3  HCT 45.6 45.3 43.7  MCV 91.4  --  92.0  PLT 256  --  244   Cardiac Enzymes: No results for input(s): CKTOTAL, CKMB, CKMBINDEX, TROPONINI in the last 168 hours. BNP: Invalid input(s): POCBNP CBG: No results for input(s): GLUCAP in the last 168 hours. D-Dimer No results for input(s): DDIMER in the last 72 hours. Hgb A1c No results for input(s): HGBA1C in the last 72 hours. Lipid Profile No results for input(s): CHOL, HDL, LDLCALC, TRIG, CHOLHDL, LDLDIRECT in the last  72 hours. Thyroid function studies No results for input(s): TSH, T4TOTAL, T3FREE, THYROIDAB in the last 72 hours.  Invalid input(s): FREET3 Anemia work up No results for input(s): VITAMINB12, FOLATE, FERRITIN, TIBC, IRON, RETICCTPCT in the last 72 hours. Urinalysis    Component Value Date/Time   COLORURINE YELLOW 11/11/2010 1834   APPEARANCEUR TURBID (A) 11/11/2010 1834   LABSPEC 1.013 11/11/2010 1834   PHURINE 8.0 11/11/2010 1834   GLUCOSEU NEGATIVE 11/11/2010 1834   HGBUR NEGATIVE 11/11/2010 1834   BILIRUBINUR NEGATIVE 11/11/2010 1834   KETONESUR NEGATIVE 11/11/2010 1834   PROTEINUR NEGATIVE 11/11/2010 1834   UROBILINOGEN 0.2 11/11/2010 1834   NITRITE NEGATIVE 11/11/2010 1834   LEUKOCYTESUR NEGATIVE 11/11/2010 1834   Sepsis Labs Invalid input(s): PROCALCITONIN,  WBC,  LACTICIDVEN Microbiology Recent Results (from the past 240 hour(s))  SARS CORONAVIRUS 2 (TAT 6-24 HRS) Nasopharyngeal Nasopharyngeal Swab     Status: None   Collection Time: 09/27/18  4:43 PM   Specimen: Nasopharyngeal Swab  Result Value Ref Range Status   SARS Coronavirus 2 NEGATIVE NEGATIVE Final    Comment: (NOTE) SARS-CoV-2 target nucleic acids are NOT DETECTED. The SARS-CoV-2 RNA is generally detectable in upper and lower respiratory specimens during the acute phase of infection. Negative results do not preclude SARS-CoV-2 infection, do not rule out co-infections with other pathogens, and should not be used as the sole basis for treatment or other patient management decisions. Negative results must be combined with clinical observations, patient history, and epidemiological information. The expected result is Negative. Fact Sheet for Patients: SugarRoll.be Fact Sheet for Healthcare Providers: https://www.woods-.com/ This test is not yet approved or cleared by the [place] FDA and  has been authorized for detection and/or diagnosis of SARS-CoV-2  by FDA under an Emergency Use Authorization (EUA). This EUA will remain  in effect (meaning this test can be used) for the duration of the COVID-19 declaration under Section 56 4(b)(1) of the Act, 21 U.S.C. section 360bbb-3(b)(1), unless the authorization is terminated or revoked sooner. Performed at [facility], [place] [address], [place], [place] [address]      Time coordinating discharge: 36 minutes  SIGNED:   Georgette Shell, MD  [facility] 09/28/2018, 10:49 AM  If 7PM-7AM, please contact night-coverage [email or web address] Password [email or web address]

## 2018-09-29 ENCOUNTER — Telehealth: Payer: Self-pay | Admitting: *Deleted

## 2018-09-29 ENCOUNTER — Telehealth: Payer: Self-pay | Admitting: Gastroenterology

## 2018-09-29 NOTE — Telephone Encounter (Signed)
Yes, thank you.  I spoke to him this morning and he was doing well, without recurrent bleeding since discharge the day before.

## 2018-09-29 NOTE — Telephone Encounter (Signed)
1. Have you developed a fever since your procedure? no  2.   Have you had an respiratory symptoms (SOB or cough) since your procedure? no  3.   Have you tested positive for COVID 19 since your procedure no  4.   Have you had any family members/close contacts diagnosed with the COVID 19 since your procedure?  no   If yes to any of these questions please route to Joylene John, RN and Alphonsa Gin, Therapist, sports.  Follow up Call-  Call back number 09/25/2018  Post procedure Call Back phone  # [phone]  Permission to leave phone message Yes  Some recent data might be hidden     Patient questions:  Do you have a fever, pain , or abdominal swelling? No. Pain Score  0 *  Have you tolerated food without any problems? Yes.    Have you been able to return to your normal activities? Yes.    Do you have any questions about your discharge instructions: Diet   No. Medications  No. Follow up visit  No.  Do you have questions or concerns about your Care? No.  Actions: * If pain score is 4 or above: No action needed, pain <4. Pt says he is doing ok now ,but that he had bleeding after his procedure and went to ER and had to be admitted over night to hospital .Bleeding stopped after began prep for anticipated repeat colonoscopy and has been discharged with no bleeding or further complications thus far.

## 2018-10-02 ENCOUNTER — Encounter: Payer: Self-pay | Admitting: Gastroenterology

## 2019-02-10 DIAGNOSIS — Z125 Encounter for screening for malignant neoplasm of prostate: Secondary | ICD-10-CM | POA: Diagnosis not present

## 2019-02-10 DIAGNOSIS — E785 Hyperlipidemia, unspecified: Secondary | ICD-10-CM | POA: Diagnosis not present

## 2019-02-10 DIAGNOSIS — Z0000 Encounter for general adult medical examination without abnormal findings: Secondary | ICD-10-CM | POA: Diagnosis not present

## 2019-02-10 DIAGNOSIS — I10 Essential (primary) hypertension: Secondary | ICD-10-CM | POA: Diagnosis not present

## 2019-02-10 DIAGNOSIS — K219 Gastro-esophageal reflux disease without esophagitis: Secondary | ICD-10-CM | POA: Diagnosis not present

## 2019-02-10 DIAGNOSIS — N401 Enlarged prostate with lower urinary tract symptoms: Secondary | ICD-10-CM | POA: Diagnosis not present

## 2019-02-10 DIAGNOSIS — D67 Hereditary factor IX deficiency: Secondary | ICD-10-CM | POA: Diagnosis not present

## 2019-02-10 DIAGNOSIS — Z79899 Other long term (current) drug therapy: Secondary | ICD-10-CM | POA: Diagnosis not present

## 2019-02-10 DIAGNOSIS — R69 Illness, unspecified: Secondary | ICD-10-CM | POA: Diagnosis not present

## 2019-02-10 DIAGNOSIS — R739 Hyperglycemia, unspecified: Secondary | ICD-10-CM | POA: Diagnosis not present

## 2019-08-18 DIAGNOSIS — R69 Illness, unspecified: Secondary | ICD-10-CM | POA: Diagnosis not present

## 2019-10-07 DIAGNOSIS — D2272 Melanocytic nevi of left lower limb, including hip: Secondary | ICD-10-CM | POA: Diagnosis not present

## 2019-10-07 DIAGNOSIS — L814 Other melanin hyperpigmentation: Secondary | ICD-10-CM | POA: Diagnosis not present

## 2019-10-07 DIAGNOSIS — D225 Melanocytic nevi of trunk: Secondary | ICD-10-CM | POA: Diagnosis not present

## 2019-10-07 DIAGNOSIS — L738 Other specified follicular disorders: Secondary | ICD-10-CM | POA: Diagnosis not present

## 2019-10-07 DIAGNOSIS — L821 Other seborrheic keratosis: Secondary | ICD-10-CM | POA: Diagnosis not present

## 2019-10-07 DIAGNOSIS — D2372 Other benign neoplasm of skin of left lower limb, including hip: Secondary | ICD-10-CM | POA: Diagnosis not present

## 2019-10-07 DIAGNOSIS — D1801 Hemangioma of skin and subcutaneous tissue: Secondary | ICD-10-CM | POA: Diagnosis not present

## 2019-10-07 DIAGNOSIS — L820 Inflamed seborrheic keratosis: Secondary | ICD-10-CM | POA: Diagnosis not present

## 2019-10-30 DIAGNOSIS — Z809 Family history of malignant neoplasm, unspecified: Secondary | ICD-10-CM | POA: Diagnosis not present

## 2019-10-30 DIAGNOSIS — G8929 Other chronic pain: Secondary | ICD-10-CM | POA: Diagnosis not present

## 2019-10-30 DIAGNOSIS — I10 Essential (primary) hypertension: Secondary | ICD-10-CM | POA: Diagnosis not present

## 2019-10-30 DIAGNOSIS — E785 Hyperlipidemia, unspecified: Secondary | ICD-10-CM | POA: Diagnosis not present

## 2019-10-30 DIAGNOSIS — Z823 Family history of stroke: Secondary | ICD-10-CM | POA: Diagnosis not present

## 2019-10-30 DIAGNOSIS — Z791 Long term (current) use of non-steroidal anti-inflammatories (NSAID): Secondary | ICD-10-CM | POA: Diagnosis not present

## 2019-10-30 DIAGNOSIS — Z008 Encounter for other general examination: Secondary | ICD-10-CM | POA: Diagnosis not present

## 2019-10-30 DIAGNOSIS — E663 Overweight: Secondary | ICD-10-CM | POA: Diagnosis not present

## 2019-10-30 DIAGNOSIS — Z6829 Body mass index (BMI) 29.0-29.9, adult: Secondary | ICD-10-CM | POA: Diagnosis not present

## 2019-10-30 DIAGNOSIS — Z8249 Family history of ischemic heart disease and other diseases of the circulatory system: Secondary | ICD-10-CM | POA: Diagnosis not present

## 2019-10-30 DIAGNOSIS — M1990 Unspecified osteoarthritis, unspecified site: Secondary | ICD-10-CM | POA: Diagnosis not present

## 2019-11-02 DIAGNOSIS — R69 Illness, unspecified: Secondary | ICD-10-CM | POA: Diagnosis not present

## 2019-11-23 DIAGNOSIS — Z9181 History of falling: Secondary | ICD-10-CM | POA: Diagnosis not present

## 2019-11-23 DIAGNOSIS — Z6828 Body mass index (BMI) 28.0-28.9, adult: Secondary | ICD-10-CM | POA: Diagnosis not present

## 2019-11-23 DIAGNOSIS — L03011 Cellulitis of right finger: Secondary | ICD-10-CM | POA: Diagnosis not present

## 2019-11-23 DIAGNOSIS — E663 Overweight: Secondary | ICD-10-CM | POA: Diagnosis not present

## 2019-11-23 DIAGNOSIS — S86912A Strain of unspecified muscle(s) and tendon(s) at lower leg level, left leg, initial encounter: Secondary | ICD-10-CM | POA: Diagnosis not present

## 2019-11-23 DIAGNOSIS — S46002A Unspecified injury of muscle(s) and tendon(s) of the rotator cuff of left shoulder, initial encounter: Secondary | ICD-10-CM | POA: Diagnosis not present

## 2020-02-14 DIAGNOSIS — K2960 Other gastritis without bleeding: Secondary | ICD-10-CM | POA: Diagnosis not present

## 2020-02-14 DIAGNOSIS — R69 Illness, unspecified: Secondary | ICD-10-CM | POA: Diagnosis not present

## 2020-02-14 DIAGNOSIS — I10 Essential (primary) hypertension: Secondary | ICD-10-CM | POA: Diagnosis not present

## 2020-02-14 DIAGNOSIS — N401 Enlarged prostate with lower urinary tract symptoms: Secondary | ICD-10-CM | POA: Diagnosis not present

## 2020-02-14 DIAGNOSIS — D67 Hereditary factor IX deficiency: Secondary | ICD-10-CM | POA: Diagnosis not present

## 2020-02-14 DIAGNOSIS — E785 Hyperlipidemia, unspecified: Secondary | ICD-10-CM | POA: Diagnosis not present

## 2020-02-14 DIAGNOSIS — Z79899 Other long term (current) drug therapy: Secondary | ICD-10-CM | POA: Diagnosis not present

## 2020-02-14 DIAGNOSIS — Z0000 Encounter for general adult medical examination without abnormal findings: Secondary | ICD-10-CM | POA: Diagnosis not present

## 2020-02-14 DIAGNOSIS — R7302 Impaired glucose tolerance (oral): Secondary | ICD-10-CM | POA: Diagnosis not present

## 2020-02-14 DIAGNOSIS — Z125 Encounter for screening for malignant neoplasm of prostate: Secondary | ICD-10-CM | POA: Diagnosis not present

## 2020-07-01 DIAGNOSIS — Z7722 Contact with and (suspected) exposure to environmental tobacco smoke (acute) (chronic): Secondary | ICD-10-CM | POA: Diagnosis not present

## 2020-07-01 DIAGNOSIS — E785 Hyperlipidemia, unspecified: Secondary | ICD-10-CM | POA: Diagnosis not present

## 2020-07-01 DIAGNOSIS — I10 Essential (primary) hypertension: Secondary | ICD-10-CM | POA: Diagnosis not present

## 2020-07-01 DIAGNOSIS — Z809 Family history of malignant neoplasm, unspecified: Secondary | ICD-10-CM | POA: Diagnosis not present

## 2020-07-01 DIAGNOSIS — Z791 Long term (current) use of non-steroidal anti-inflammatories (NSAID): Secondary | ICD-10-CM | POA: Diagnosis not present

## 2020-07-01 DIAGNOSIS — G8929 Other chronic pain: Secondary | ICD-10-CM | POA: Diagnosis not present

## 2020-07-01 DIAGNOSIS — M1990 Unspecified osteoarthritis, unspecified site: Secondary | ICD-10-CM | POA: Diagnosis not present

## 2020-07-01 DIAGNOSIS — Z6830 Body mass index (BMI) 30.0-30.9, adult: Secondary | ICD-10-CM | POA: Diagnosis not present

## 2020-07-01 DIAGNOSIS — N529 Male erectile dysfunction, unspecified: Secondary | ICD-10-CM | POA: Diagnosis not present

## 2020-07-01 DIAGNOSIS — E669 Obesity, unspecified: Secondary | ICD-10-CM | POA: Diagnosis not present

## 2020-07-24 DIAGNOSIS — Z0100 Encounter for examination of eyes and vision without abnormal findings: Secondary | ICD-10-CM | POA: Diagnosis not present

## 2020-09-25 DIAGNOSIS — D225 Melanocytic nevi of trunk: Secondary | ICD-10-CM | POA: Diagnosis not present

## 2020-09-25 DIAGNOSIS — L814 Other melanin hyperpigmentation: Secondary | ICD-10-CM | POA: Diagnosis not present

## 2020-09-25 DIAGNOSIS — D2239 Melanocytic nevi of other parts of face: Secondary | ICD-10-CM | POA: Diagnosis not present

## 2020-09-25 DIAGNOSIS — L821 Other seborrheic keratosis: Secondary | ICD-10-CM | POA: Diagnosis not present

## 2020-09-25 DIAGNOSIS — D171 Benign lipomatous neoplasm of skin and subcutaneous tissue of trunk: Secondary | ICD-10-CM | POA: Diagnosis not present

## 2020-09-25 DIAGNOSIS — D485 Neoplasm of uncertain behavior of skin: Secondary | ICD-10-CM | POA: Diagnosis not present

## 2020-09-25 DIAGNOSIS — D2372 Other benign neoplasm of skin of left lower limb, including hip: Secondary | ICD-10-CM | POA: Diagnosis not present

## 2020-10-02 DIAGNOSIS — H2513 Age-related nuclear cataract, bilateral: Secondary | ICD-10-CM | POA: Diagnosis not present

## 2020-10-02 DIAGNOSIS — G43B0 Ophthalmoplegic migraine, not intractable: Secondary | ICD-10-CM | POA: Diagnosis not present

## 2020-10-02 DIAGNOSIS — H5203 Hypermetropia, bilateral: Secondary | ICD-10-CM | POA: Diagnosis not present

## 2020-10-02 DIAGNOSIS — H524 Presbyopia: Secondary | ICD-10-CM | POA: Diagnosis not present

## 2021-02-23 DIAGNOSIS — N138 Other obstructive and reflux uropathy: Secondary | ICD-10-CM | POA: Diagnosis not present

## 2021-02-23 DIAGNOSIS — D67 Hereditary factor IX deficiency: Secondary | ICD-10-CM | POA: Diagnosis not present

## 2021-02-23 DIAGNOSIS — R7301 Impaired fasting glucose: Secondary | ICD-10-CM | POA: Diagnosis not present

## 2021-02-23 DIAGNOSIS — G4730 Sleep apnea, unspecified: Secondary | ICD-10-CM | POA: Diagnosis not present

## 2021-02-23 DIAGNOSIS — R69 Illness, unspecified: Secondary | ICD-10-CM | POA: Diagnosis not present

## 2021-02-23 DIAGNOSIS — E785 Hyperlipidemia, unspecified: Secondary | ICD-10-CM | POA: Diagnosis not present

## 2021-02-23 DIAGNOSIS — Z79899 Other long term (current) drug therapy: Secondary | ICD-10-CM | POA: Diagnosis not present

## 2021-02-23 DIAGNOSIS — N401 Enlarged prostate with lower urinary tract symptoms: Secondary | ICD-10-CM | POA: Diagnosis not present

## 2021-02-23 DIAGNOSIS — Z0000 Encounter for general adult medical examination without abnormal findings: Secondary | ICD-10-CM | POA: Diagnosis not present

## 2021-02-23 DIAGNOSIS — I10 Essential (primary) hypertension: Secondary | ICD-10-CM | POA: Diagnosis not present

## 2021-05-30 DIAGNOSIS — S20469A Insect bite (nonvenomous) of unspecified back wall of thorax, initial encounter: Secondary | ICD-10-CM | POA: Diagnosis not present

## 2021-06-12 DIAGNOSIS — I10 Essential (primary) hypertension: Secondary | ICD-10-CM | POA: Diagnosis not present

## 2021-06-12 DIAGNOSIS — Z6830 Body mass index (BMI) 30.0-30.9, adult: Secondary | ICD-10-CM | POA: Diagnosis not present

## 2021-06-12 DIAGNOSIS — R69 Illness, unspecified: Secondary | ICD-10-CM | POA: Diagnosis not present

## 2021-08-12 DIAGNOSIS — J029 Acute pharyngitis, unspecified: Secondary | ICD-10-CM | POA: Diagnosis not present

## 2021-08-12 DIAGNOSIS — R051 Acute cough: Secondary | ICD-10-CM | POA: Diagnosis not present

## 2021-08-12 DIAGNOSIS — M791 Myalgia, unspecified site: Secondary | ICD-10-CM | POA: Diagnosis not present

## 2021-09-24 DIAGNOSIS — D225 Melanocytic nevi of trunk: Secondary | ICD-10-CM | POA: Diagnosis not present

## 2021-09-24 DIAGNOSIS — L821 Other seborrheic keratosis: Secondary | ICD-10-CM | POA: Diagnosis not present

## 2021-09-24 DIAGNOSIS — L820 Inflamed seborrheic keratosis: Secondary | ICD-10-CM | POA: Diagnosis not present

## 2021-09-24 DIAGNOSIS — D171 Benign lipomatous neoplasm of skin and subcutaneous tissue of trunk: Secondary | ICD-10-CM | POA: Diagnosis not present

## 2021-09-24 DIAGNOSIS — D2372 Other benign neoplasm of skin of left lower limb, including hip: Secondary | ICD-10-CM | POA: Diagnosis not present

## 2021-09-24 DIAGNOSIS — D2271 Melanocytic nevi of right lower limb, including hip: Secondary | ICD-10-CM | POA: Diagnosis not present

## 2021-09-24 DIAGNOSIS — D2272 Melanocytic nevi of left lower limb, including hip: Secondary | ICD-10-CM | POA: Diagnosis not present

## 2021-09-24 DIAGNOSIS — D1801 Hemangioma of skin and subcutaneous tissue: Secondary | ICD-10-CM | POA: Diagnosis not present

## 2021-11-28 DIAGNOSIS — R051 Acute cough: Secondary | ICD-10-CM | POA: Diagnosis not present

## 2021-11-28 DIAGNOSIS — R0981 Nasal congestion: Secondary | ICD-10-CM | POA: Diagnosis not present

## 2021-11-28 DIAGNOSIS — R509 Fever, unspecified: Secondary | ICD-10-CM | POA: Diagnosis not present

## 2021-11-28 DIAGNOSIS — R519 Headache, unspecified: Secondary | ICD-10-CM | POA: Diagnosis not present

## 2021-11-28 DIAGNOSIS — J209 Acute bronchitis, unspecified: Secondary | ICD-10-CM | POA: Diagnosis not present

## 2022-02-23 DIAGNOSIS — R509 Fever, unspecified: Secondary | ICD-10-CM | POA: Diagnosis not present

## 2022-02-23 DIAGNOSIS — R051 Acute cough: Secondary | ICD-10-CM | POA: Diagnosis not present

## 2022-02-23 DIAGNOSIS — J111 Influenza due to unidentified influenza virus with other respiratory manifestations: Secondary | ICD-10-CM | POA: Diagnosis not present

## 2022-02-23 DIAGNOSIS — J069 Acute upper respiratory infection, unspecified: Secondary | ICD-10-CM | POA: Diagnosis not present

## 2022-02-23 DIAGNOSIS — R0981 Nasal congestion: Secondary | ICD-10-CM | POA: Diagnosis not present

## 2022-03-19 DIAGNOSIS — G4730 Sleep apnea, unspecified: Secondary | ICD-10-CM | POA: Diagnosis not present

## 2022-03-19 DIAGNOSIS — Z1331 Encounter for screening for depression: Secondary | ICD-10-CM | POA: Diagnosis not present

## 2022-03-19 DIAGNOSIS — I10 Essential (primary) hypertension: Secondary | ICD-10-CM | POA: Diagnosis not present

## 2022-03-19 DIAGNOSIS — Z6828 Body mass index (BMI) 28.0-28.9, adult: Secondary | ICD-10-CM | POA: Diagnosis not present

## 2022-03-19 DIAGNOSIS — N138 Other obstructive and reflux uropathy: Secondary | ICD-10-CM | POA: Diagnosis not present

## 2022-03-19 DIAGNOSIS — D67 Hereditary factor IX deficiency: Secondary | ICD-10-CM | POA: Diagnosis not present

## 2022-03-19 DIAGNOSIS — E785 Hyperlipidemia, unspecified: Secondary | ICD-10-CM | POA: Diagnosis not present

## 2022-03-19 DIAGNOSIS — E663 Overweight: Secondary | ICD-10-CM | POA: Diagnosis not present

## 2022-03-19 DIAGNOSIS — Z0000 Encounter for general adult medical examination without abnormal findings: Secondary | ICD-10-CM | POA: Diagnosis not present

## 2022-03-19 DIAGNOSIS — R69 Illness, unspecified: Secondary | ICD-10-CM | POA: Diagnosis not present

## 2022-03-19 DIAGNOSIS — K2960 Other gastritis without bleeding: Secondary | ICD-10-CM | POA: Diagnosis not present

## 2022-03-19 DIAGNOSIS — N401 Enlarged prostate with lower urinary tract symptoms: Secondary | ICD-10-CM | POA: Diagnosis not present

## 2022-03-22 DIAGNOSIS — Z79899 Other long term (current) drug therapy: Secondary | ICD-10-CM | POA: Diagnosis not present

## 2022-03-22 DIAGNOSIS — Z125 Encounter for screening for malignant neoplasm of prostate: Secondary | ICD-10-CM | POA: Diagnosis not present

## 2022-03-22 DIAGNOSIS — E785 Hyperlipidemia, unspecified: Secondary | ICD-10-CM | POA: Diagnosis not present

## 2022-04-10 DIAGNOSIS — R32 Unspecified urinary incontinence: Secondary | ICD-10-CM | POA: Diagnosis not present

## 2022-04-10 DIAGNOSIS — M1990 Unspecified osteoarthritis, unspecified site: Secondary | ICD-10-CM | POA: Diagnosis not present

## 2022-04-10 DIAGNOSIS — Z791 Long term (current) use of non-steroidal anti-inflammatories (NSAID): Secondary | ICD-10-CM | POA: Diagnosis not present

## 2022-04-10 DIAGNOSIS — E669 Obesity, unspecified: Secondary | ICD-10-CM | POA: Diagnosis not present

## 2022-04-10 DIAGNOSIS — J45909 Unspecified asthma, uncomplicated: Secondary | ICD-10-CM | POA: Diagnosis not present

## 2022-04-10 DIAGNOSIS — G4730 Sleep apnea, unspecified: Secondary | ICD-10-CM | POA: Diagnosis not present

## 2022-04-10 DIAGNOSIS — Z008 Encounter for other general examination: Secondary | ICD-10-CM | POA: Diagnosis not present

## 2022-04-10 DIAGNOSIS — Z880 Allergy status to penicillin: Secondary | ICD-10-CM | POA: Diagnosis not present

## 2022-04-10 DIAGNOSIS — E785 Hyperlipidemia, unspecified: Secondary | ICD-10-CM | POA: Diagnosis not present

## 2022-04-10 DIAGNOSIS — Z87891 Personal history of nicotine dependence: Secondary | ICD-10-CM | POA: Diagnosis not present

## 2022-04-10 DIAGNOSIS — R69 Illness, unspecified: Secondary | ICD-10-CM | POA: Diagnosis not present

## 2022-04-10 DIAGNOSIS — I10 Essential (primary) hypertension: Secondary | ICD-10-CM | POA: Diagnosis not present

## 2022-04-10 DIAGNOSIS — Z9181 History of falling: Secondary | ICD-10-CM | POA: Diagnosis not present

## 2022-05-27 DIAGNOSIS — H5203 Hypermetropia, bilateral: Secondary | ICD-10-CM | POA: Diagnosis not present

## 2022-05-27 DIAGNOSIS — G43B0 Ophthalmoplegic migraine, not intractable: Secondary | ICD-10-CM | POA: Diagnosis not present

## 2022-05-27 DIAGNOSIS — H524 Presbyopia: Secondary | ICD-10-CM | POA: Diagnosis not present

## 2022-05-27 DIAGNOSIS — H2513 Age-related nuclear cataract, bilateral: Secondary | ICD-10-CM | POA: Diagnosis not present

## 2022-06-28 DIAGNOSIS — R0602 Shortness of breath: Secondary | ICD-10-CM | POA: Diagnosis not present

## 2022-06-28 DIAGNOSIS — R5383 Other fatigue: Secondary | ICD-10-CM | POA: Diagnosis not present

## 2022-06-28 DIAGNOSIS — G4733 Obstructive sleep apnea (adult) (pediatric): Secondary | ICD-10-CM | POA: Diagnosis not present

## 2022-06-28 DIAGNOSIS — R400 Somnolence: Secondary | ICD-10-CM | POA: Diagnosis not present

## 2022-07-09 ENCOUNTER — Telehealth: Payer: Self-pay

## 2022-07-09 NOTE — Patient Outreach (Signed)
  Care Coordination   Initial Visit Note   07/09/2022 Name: David Chen MRN: [id number] DOB: 09-29-1950  David Chen is a 72 y.o. year old male who sees Street, Stephanie Coup, MD for primary care. I spoke with  David Chen by phone today.  What matters to the patients health and wellness today?  Placed call to patient to review and offer [facility] care coordination program. Patient reports that he is having a hard time with sleeping. Reports the he is scheduled for a sleep study next week.    Would like a call back after the sleep study.  Reports going to the gym, taking yoga classes.    SDOH assessments and interventions completed:  No     Care Coordination Interventions:  Yes, provided  Interventions Today    Flowsheet Row Most Recent Value  Chronic Disease   Chronic disease during today's visit Other  [difficulty sleeping]  Education Interventions   Education Provided Provided Education  [Reviewed sleep apnea. reviewed patients concern for his health.  Follow up appointment scheduled.]       Follow up plan: Follow up call scheduled for 07/24/2022    Encounter Outcome:  Pt. Visit Completed  Rowe Pavy, RN, BSN, CEN [facility] Coordinator [phone]

## 2022-07-17 DIAGNOSIS — G4733 Obstructive sleep apnea (adult) (pediatric): Secondary | ICD-10-CM | POA: Diagnosis not present

## 2022-07-22 DIAGNOSIS — R400 Somnolence: Secondary | ICD-10-CM | POA: Diagnosis not present

## 2022-07-22 DIAGNOSIS — G4733 Obstructive sleep apnea (adult) (pediatric): Secondary | ICD-10-CM | POA: Diagnosis not present

## 2022-07-22 DIAGNOSIS — R5383 Other fatigue: Secondary | ICD-10-CM | POA: Diagnosis not present

## 2022-07-23 DIAGNOSIS — R051 Acute cough: Secondary | ICD-10-CM | POA: Diagnosis not present

## 2022-07-23 DIAGNOSIS — J209 Acute bronchitis, unspecified: Secondary | ICD-10-CM | POA: Diagnosis not present

## 2022-07-23 DIAGNOSIS — R0981 Nasal congestion: Secondary | ICD-10-CM | POA: Diagnosis not present

## 2022-07-24 ENCOUNTER — Telehealth: Payer: Self-pay

## 2022-07-24 ENCOUNTER — Ambulatory Visit: Payer: Self-pay

## 2022-07-24 NOTE — Patient Outreach (Signed)
  Care Coordination   07/24/2022 Name: ZYGMUND PASSERO MRN: [id number] DOB: Nov 01, 1950   Care Coordination Outreach Attempts:  An unsuccessful telephone outreach was attempted for a scheduled appointment today.  Follow Up Plan:  Additional outreach attempts will be made to offer the patient care coordination information and services.   Encounter Outcome:  No Answer   Care Coordination Interventions:  No, not indicated    Rowe Pavy, RN, BSN, [facility] [employer] [phone]

## 2022-07-24 NOTE — Patient Outreach (Signed)
  Care Coordination   Follow Up Visit Note   07/24/2022 Name: David Chen MRN: [id number] DOB: [birth year]  David Chen is a 72 y.o. year old male who sees Street, Stephanie Coup, MD for primary care. I spoke with  David Chen by phone today.  What matters to the patients health and wellness today?  Incoming call from patient who states that he is going to start using a CPAP machine.  Reports that he is getting his machine in [place]. Reports that he is waiting to get a call to pick up machine.   Reports that he his MD follow up on 09/25/2022.  Reports recent loss of dog.  Taking melatonin. Patient reports that he would like to call me back if he wants to enroll in program. Again reviewed benefits of [facility] care coordination program.      SDOH assessments and interventions completed:  No     Care Coordination Interventions:  No, not indicated   Follow up plan: No further intervention required.   Encounter Outcome:  Pt. Visit Completed   Rowe Pavy, RN, BSN, CEN [facility] Coordinator [phone]

## 2022-08-01 ENCOUNTER — Telehealth: Payer: Self-pay

## 2022-08-01 DIAGNOSIS — G4733 Obstructive sleep apnea (adult) (pediatric): Secondary | ICD-10-CM | POA: Diagnosis not present

## 2022-08-01 NOTE — Patient Outreach (Signed)
  Care Coordination   Initial Visit Note   08/01/2022 Name: David Chen MRN: [id number] DOB: 1950-09-08  David Chen is a 72 y.o. year old male who sees Street, Stephanie Coup, MD for primary care. I spoke with  David Chen by phone today.  What matters to the patients health and wellness today?  Patient called and left me a voicemail. Reports that he is interested in program. I returned call and patient is driving out of town. States that he thinks he is already enrolled.   Reviewed with patient that he has a [facility] doctor but is not enrolled in care management program.  Patient request we touch base on Monday. I will await patient to call me back.     SDOH assessments and interventions completed:  No     Care Coordination Interventions:  Yes, provided   Interventions Today    Flowsheet Row Most Recent Value  Education Interventions   Education Provided Provided Education  [Reviewed and explained [facility] program and benefits.]        Follow up plan:  will await patients call back to me.     Encounter Outcome:  Pt. Visit Completed   Rowe Pavy, RN, BSN, CEN [facility] Coordinator [phone]

## 2022-08-06 DIAGNOSIS — Z6828 Body mass index (BMI) 28.0-28.9, adult: Secondary | ICD-10-CM | POA: Diagnosis not present

## 2022-08-06 DIAGNOSIS — F4320 Adjustment disorder, unspecified: Secondary | ICD-10-CM | POA: Diagnosis not present

## 2022-08-07 ENCOUNTER — Telehealth: Payer: Self-pay

## 2022-08-07 NOTE — Patient Outreach (Signed)
  Care Coordination   Initial Visit Note   08/07/2022 Name: David Chen MRN: [id number] DOB: [birth year]  David Chen is a 72 y.o. year old male who sees Street, Stephanie Coup, MD for primary care. I spoke with  David Chen by phone today.  What matters to the patients health and wellness today?  Patient  left me a voicemail. Returned called. Patient is interested in [facility] care coordination program. Verbal consent provided.      SDOH assessments and interventions completed:  No     Care Coordination Interventions:  Yes, provided   Follow up plan: Follow up call scheduled for 08/08/2022    Encounter Outcome:  Pt. Visit Completed    Rowe Pavy, RN, BSN, CEN [facility] Coordinator [phone]

## 2022-08-08 ENCOUNTER — Ambulatory Visit: Payer: Self-pay

## 2022-08-08 NOTE — Patient Outreach (Signed)
  Care Coordination   Initial Visit Note   08/08/2022 Name: David Chen MRN: [id number] DOB: 1950/02/24  David Chen is a 72 y.o. year old male who sees Street, Stephanie Coup, MD for primary care. I spoke with  David Chen by phone today.  What matters to the patients health and wellness today?  Sleeping problems and grieving the loss of his dog.  Reports that he has started using CPAP for the last 4 days.   Reports that is is now sleeping in a chair in the living room.  Reports that his pet used to sleep with him and he is not able to sleep in his bedroom at this point. MD just started patient on Lexapro and Ativan as needed.   Recently lost 2 pets in 1 year. Pet was put to sleep on 08/05/2022.   Goals Addressed               This Visit's Progress     I would like improved sleep and help with grief of my pet. (pt-stated)        Interventions Today    Flowsheet Row Most Recent Value  Chronic Disease   Chronic disease during today's visit Hypertension (HTN)  General Interventions   General Interventions Discussed/Reviewed General Interventions Discussed, Durable Medical Equipment (DME), Doctor Visits, Communication with  Doctor Visits Discussed/Reviewed Doctor Visits Discussed  [social worker Marylu Lund Caldwell]  Communication with Social Work  Exercise Interventions   Exercise Discussed/Reviewed Physical Activity  Physical Activity Discussed/Reviewed Physical Activity Discussed, Types of exercise, Gym  Education Interventions   Education Provided Provided Education  [Reviewed how to improve sleep]  Provided Verbal Education On Nutrition, Exercise, Medication, When to see the doctor  Mental Health Interventions   Mental Health Discussed/Reviewed Grief and Loss  [of PET]  Nutrition Interventions   Nutrition Discussed/Reviewed Nutrition Discussed  Pharmacy Interventions   Pharmacy Dicussed/Reviewed Medications and their functions  Safety Interventions   Safety Discussed/Reviewed  Fall Risk  Advanced Directive Interventions   Advanced Directives Discussed/Reviewed Advanced Directives Discussed              SDOH assessments and interventions completed:  Yes  SDOH Interventions Today    Flowsheet Row Most Recent Value  SDOH Interventions   Food Insecurity Interventions Intervention Not Indicated  Housing Interventions Intervention Not Indicated  Transportation Interventions Intervention Not Indicated  Alcohol Usage Interventions Patient Declined  Depression Interventions/Treatment  Medication  Physical Activity Interventions Intervention Not Indicated, [facility] [employer]  Social Connections Interventions Intervention Not Indicated  Health Literacy Interventions Intervention Not Indicated        Care Coordination Interventions:  Yes, provided   Follow up plan: Follow up call scheduled for 08/21/2022    Encounter Outcome:  Pt. Visit Completed   Rowe Pavy, RN, BSN, CEN [facility] Coordinator [phone]

## 2022-08-12 ENCOUNTER — Ambulatory Visit: Payer: Self-pay | Admitting: *Deleted

## 2022-08-12 ENCOUNTER — Encounter: Payer: Self-pay | Admitting: *Deleted

## 2022-08-12 NOTE — Patient Instructions (Signed)
Visit Information  Thank you for taking time to visit with me today. Please don't hesitate to contact me if I can be of assistance to you.   Following are the goals we discussed today:   Goals Addressed             This Visit's Progress    Improve health and mental health       Activities and task to complete in order to accomplish goals.   Call your insurance provider for more information about your Enhanced Benefits (in-network Counseling providers, copay, etc) Consider call for support through [facility] (will email you info) Call to schedule your counseling appointment once you select provider. Start / continue relaxed breathing 3 times daily Keep all upcoming appointment discussed today Continue with compliance of taking medication prescribed by Doctor Self Support options  (journaling, find ways to remember Mia, find ways to engage and remain active, distract self from negative thoughts through positive action) Review your EMMI educational information   Look for an e-mail from [facility]. Continue to participate in activities at [facility] Reduce alcohol intake as discussed Call 988 for 24/7 contact with mental health support Call 911 for all emergency needs        Our next appointment is by telephone on 08/21/22    Please call the care guide team at [phone] if you need to cancel or reschedule your appointment.   If you are experiencing a Mental Health or Behavioral Health Crisis or need someone to talk to, please call the Suicide and Crisis Lifeline: 988 call 911   The patient verbalized understanding of instructions, educational materials, and care plan provided today and DECLINED offer to receive copy of patient instructions, educational materials, and care plan.   Telephone follow up appointment with care management team member scheduled for: 08/21/22  Reece Levy, MSW, LCSW Clinical Social Worker [facility] [employer] [phone]

## 2022-08-12 NOTE — Patient Outreach (Signed)
  Care Coordination   Initial Visit Note   08/12/2022 Name: David Chen MRN: [id number] DOB: April 01, 1950  David Chen is a 72 y.o. year old male who sees Street, Stephanie Coup, MD for primary care. I spoke with  David Chen by phone today.  What matters to the patients health and wellness today?  Getting help with coping after loss of pet, Mia.     Goals Addressed             This Visit's Progress    Improve health and mental health       Activities and task to complete in order to accomplish goals.   Call your insurance provider for more information about your Enhanced Benefits (in-network Counseling providers, copay, etc) Consider call for support through [facility] (will email you info) Call to schedule your counseling appointment once you select provider. Start / continue relaxed breathing 3 times daily Keep all upcoming appointment discussed today Continue with compliance of taking medication prescribed by Doctor Self Support options  (journaling, find ways to remember Mia, find ways to engage and remain active, distract self from negative thoughts through positive action) Review your EMMI educational information   Look for an e-mail from [facility]. Continue to participate in activities at local [facility] Reduce alcohol intake as discussed Call 988 for 24/7 contact with mental health support Call 911 for all emergency needs        SDOH assessments and interventions completed:  Yes  SDOH Interventions Today    Flowsheet Row Most Recent Value  SDOH Interventions   Depression Interventions/Treatment  Medication, Counseling        Care Coordination Interventions:  Yes, provided  Interventions Today    Flowsheet Row Most Recent Value  Chronic Disease   Chronic disease during today's visit Other  [grief/loss, depression]  General Interventions   General Interventions Discussed/Reviewed General Interventions Discussed, General Interventions Reviewed,  Holiday representative Visits Discussed/Reviewed Specialist  PCP/Specialist Visits Contact provider for referral to  [Pt wants to pursue Counseling and will call to schedule with agency after calling insurance to inquire about coverage and in-network options- may use [facility] where he went in past]  Exercise Interventions   Exercise Discussed/Reviewed Exercise Discussed, Physical Activity  [Pt is active with the local [employer] program- swimming, weights and added yoga class]  Physical Activity Discussed/Reviewed Types of exercise  [continues with local Y programs]  Education Interventions   Education Provided Provided Education  Provided Verbal Education On Mental Health/Coping with Illness, Medication, Other, Community Resources  [Discussed pt's ETOH use which he reports he is working to reduce- discussed alcohol as a depressant and its negative impacts]  Mental Health Interventions   Mental Health Discussed/Reviewed Mental Health Discussed, Mental Health Reviewed, Coping Strategies, Grief and Loss, Depression, Other  [Pt acknowledges his recent worsening depression after the loss of his close companion/pet, Mia. PHQ score showing moderate symptoms- denies SI. Pt started on RX to help along with other RX previously prescribed. Pt wants to seek counseling support]  Pharmacy Interventions   Pharmacy Dicussed/Reviewed Pharmacy Topics Discussed, Pharmacy Topics Reviewed  [Pt advised to review side effects and RX warnings related to driving, alcohol use, etc.]  Safety Interventions   Safety Discussed/Reviewed Safety Discussed       Follow up plan: Follow up call scheduled for 08/21/22    Encounter Outcome:  Pt. Visit Completed

## 2022-08-21 ENCOUNTER — Ambulatory Visit: Payer: Self-pay | Admitting: *Deleted

## 2022-08-21 ENCOUNTER — Ambulatory Visit: Payer: Self-pay

## 2022-08-21 NOTE — Patient Outreach (Signed)
  Care Coordination   Follow Up Visit Note   08/21/2022 Name: David Chen MRN: [id number] DOB: 1950-12-27  David Chen is a 72 y.o. year old male who sees Street, Stephanie Coup, MD for primary care. I spoke with  David Chen by phone today.  What matters to the patients health and wellness today?  Pt reports he has not had any alcohol since our initial talk 08/12/22.    Goals Addressed             This Visit's Progress    Improve health and mental health       Activities and task to complete in order to accomplish goals.   Continue with your counseling visits with David Chen. Review EMMI material I have emailed to you on depression/coping, loss of pet, alcohol, and CPAP use- sent via email Start / continue relaxed breathing 3 times daily Keep all upcoming appointment discussed today Continue with compliance of taking medication prescribed by Doctor Self Support options  (journaling, find ways to remember Mia, find ways to engage and remain active, distract self from negative thoughts through positive action) Continue to participate in activities at [facility] Continue with your good work at eliminating alcohol use! Great work!! Call 988 for 24/7 contact with mental health support Call 911 for all emergency needs        SDOH assessments and interventions completed:  Yes     Care Coordination Interventions:  Yes, provided  Interventions Today    Flowsheet Row Most Recent Value  Chronic Disease   Chronic disease during today's visit Other  General Interventions   General Interventions Discussed/Reviewed General Interventions Discussed, Set designer provided pt with material via email to review re: grief/loss and depression]  Exercise Interventions   Physical Activity Discussed/Reviewed Physical Activity Discussed  [Pt remains active and is thinking about getting a (Lab) puppy after some home repairs are made next month.]  Education Interventions   Education  Provided Provided Education  Provided Verbal Education On Mental Health/Coping with Illness, Other  [sleep, alcohol use, depression, grief/loss]  Mental Health Interventions   Mental Health Discussed/Reviewed Mental Health Discussed, Mental Health Reviewed, Coping Strategies, Depression, Grief and Loss, Substance Abuse  [Pt feeling better/coping with the loss of "Mia". He has had 1 session wit counseling support and plans to have 2nd visit next week, Pt also reports conitinuing to take RX for depression and sleep- Pt has abstained from alcohol use since our last visit.]  Pharmacy Interventions   Pharmacy Dicussed/Reviewed Pharmacy Topics Discussed  [Pt reports he continues to take RX for depression and feels like it is beginning to work/help. He is trying to take less (prescribed for 1-2) Lorazepam at night to help sleep.]       Follow up plan: Follow up call scheduled for 09/11/22    Encounter Outcome:  Pt. Visit Completed

## 2022-08-21 NOTE — Patient Outreach (Signed)
  Care Coordination   Follow Up Visit Note   08/21/2022 Name: David Chen MRN: [id number] DOB: Sep 08, 1950  David Chen is a 72 y.o. year old male who sees Street, Stephanie Coup, MD for primary care. I spoke with  David Chen by phone today.  What matters to the patients health and wellness today?  Patient reports that he is doing well.  Reports that he feels like the medication is kicking in.  Reports that he spoke with counselor.  Patient reports that he continues to wake up at night. Reports that he continues to sleep in the living room.   Denies any new problems or concerns.  Denies any medication changes.     Goals Addressed               This Visit's Progress     I would like improved sleep and help with grief of my pet. (pt-stated)        Interventions Today    Flowsheet Row Most Recent Value  Chronic Disease   Chronic disease during today's visit Other  [difficulty sleeping and grief]  General Interventions   General Interventions Discussed/Reviewed General Interventions Reviewed  Doctor Visits Discussed/Reviewed Doctor Visits Discussed  PCP/Specialist Visits Compliance with follow-up visit  Exercise Interventions   Exercise Discussed/Reviewed Physical Activity  Physical Activity Discussed/Reviewed Types of exercise  Mental Health Interventions   Mental Health Discussed/Reviewed --  [reviewed recent visit with counselor and with LCSW.]  Pharmacy Interventions   Pharmacy Dicussed/Reviewed Medications and their functions              SDOH assessments and interventions completed:  No     Care Coordination Interventions:  Yes, provided   Follow up plan: Follow up call scheduled for 09/11/2022    Encounter Outcome:  Pt. Visit Completed   Rowe Pavy, RN, BSN, CEN [facility] Coordinator [phone]

## 2022-08-21 NOTE — Patient Instructions (Signed)
Visit Information  Thank you for taking time to visit with me today. Please don't hesitate to contact me if I can be of assistance to you.   Following are the goals we discussed today:   Goals Addressed             This Visit's Progress    Improve health and mental health       Activities and task to complete in order to accomplish goals.   Continue with your counseling visits with Jerl Mina. Review EMMI material I have emailed to you on depression/coping, loss of pet, alcohol, and CPAP use- sent via email Start / continue relaxed breathing 3 times daily Keep all upcoming appointment discussed today Continue with compliance of taking medication prescribed by Doctor Self Support options  (journaling, find ways to remember Mia, find ways to engage and remain active, distract self from negative thoughts through positive action) Continue to participate in [facility] at [facility] Continue with your good work at eliminating alcohol use! Great work!! Call 988 for 24/7 contact with mental health support Call 911 for all emergency needs        Our next appointment is by telephone on 09/11/22 at 10:15  Please call the care guide team at [phone] if you need to cancel or reschedule your appointment.   If you are experiencing a Mental Health or Behavioral Health Crisis or need someone to talk to, please call the Suicide and Crisis Lifeline: 988 call 911   The patient verbalized understanding of instructions, educational materials, and care plan provided today and DECLINED offer to receive copy of patient instructions, educational materials, and care plan.   Telephone follow up appointment with care management team member scheduled for:09/11/22  Reece Levy, MSW, LCSW Clinical Social Worker [facility] [employer] [phone]

## 2022-08-28 DIAGNOSIS — G4733 Obstructive sleep apnea (adult) (pediatric): Secondary | ICD-10-CM | POA: Diagnosis not present

## 2022-09-01 DIAGNOSIS — G4733 Obstructive sleep apnea (adult) (pediatric): Secondary | ICD-10-CM | POA: Diagnosis not present

## 2022-09-11 ENCOUNTER — Ambulatory Visit: Payer: Self-pay | Admitting: *Deleted

## 2022-09-11 NOTE — Patient Outreach (Unsigned)
  Care Coordination   Follow Up Visit Note   09/12/2022 Name: David Chen MRN: [id number] DOB: [birth year]  David Chen is a 72 y.o. year old male who sees Street, Stephanie Coup, MD for primary care. I spoke with  David Chen by phone today.  What matters to the patients health and wellness today?  Continuing to grieve and accept loss of pet. Reduce symptoms of depression/grief.    Goals Addressed   None     SDOH assessments and interventions completed:  Yes     09/11/2022   10:25 AM 08/12/2022    9:10 AM 08/08/2022   11:16 AM  Depression screen PHQ 2/9  Decreased Interest 1 3 1   Down, Depressed, Hopeless 1 2 2   PHQ - 2 Score 2 5 3   Altered sleeping 1 2 2   Tired, decreased energy 0 2 2  Change in appetite 0 1 1  Feeling bad or failure about yourself  0 0 0  Trouble concentrating 0 3 2  Moving slowly or fidgety/restless 0 0 2  Suicidal thoughts 0 0 0  PHQ-9 Score 3 13 12   Difficult doing work/chores Not difficult at all Somewhat difficult      SDOH Interventions Today    Flowsheet Row Most Recent Value  SDOH Interventions   Depression Interventions/Treatment  Medication, Counseling        Care Coordination Interventions:  Yes, provided  Interventions Today    Flowsheet Row Most Recent Value  Chronic Disease   Chronic disease during today's visit Other  [depression/grief and loss]  General Interventions   General Interventions Discussed/Reviewed General Interventions Discussed, Community Resources  Exercise Interventions   Physical Activity Discussed/Reviewed Physical Activity Discussed  [Pt shares he has been going to the hunt club in [place] to prep for hunting season- also plans to do some archery this year]  Education Interventions   Education Provided Provided Education, Provided Web-based Education  [Pt received EMMI material emailed to him re: pet loss, depression, alcohol, etc and has read]  Provided Verbal Education On Mental Health/Coping with Illness  Mental  Health Interventions   Mental Health Discussed/Reviewed --  [Pt also shares he has been able to return to sleeping in his bed and remains alcohol free!]       Follow up plan: Follow up call scheduled for 10/21/22    Encounter Outcome:  Patient Visit Completed

## 2022-09-12 NOTE — Patient Instructions (Signed)
Visit Information  Thank you for taking time to visit with me today. Please don't hesitate to contact me if I can be of assistance to you.   Following are the goals we discussed today:   Goals Addressed   None     Our next appointment is by telephone on 10/21/22 9am  Please call the care guide team at [phone] if you need to cancel or reschedule your appointment.   If you are experiencing a Mental Health or Behavioral Health Crisis or need someone to talk to, please call the Suicide and Crisis Lifeline: 988 call 911   The patient verbalized understanding of instructions, educational materials, and care plan provided today and DECLINED offer to receive copy of patient instructions, educational materials, and care plan.   Telephone follow up appointment with care management team member scheduled for: 10/21/22  Reece Levy, MSW, LCSW Clinical Social Worker [phone]

## 2022-09-19 ENCOUNTER — Ambulatory Visit: Payer: Self-pay

## 2022-09-19 NOTE — Patient Outreach (Signed)
  Care Coordination   Follow Up Visit Note   09/19/2022 Name: David Chen MRN: [id number] DOB: 04-Nov-1950  David Chen is a 72 y.o. year old male who sees Street, Stephanie Coup, MD for primary care. I spoke with  David Chen by phone today.  What matters to the patients health and wellness today?  Patient reports that he is much better.  Reports that he is back to most of his normal activities.  Depression is decreased.  Feels less anxious.  Sleeping better without medications.  Sleeping back in his beds.  Reports staying busy.  Reports using CPAP well.   Goals Addressed               This Visit's Progress     COMPLETED: I would like improved sleep and help with grief of my pet. (pt-stated)           Interventions Today    Flowsheet Row Most Recent Value  Chronic Disease   Chronic disease during today's visit Other  [grief of loss of pet and sleep apnea.]  General Interventions   General Interventions Discussed/Reviewed General Interventions Reviewed, Doctor Visits  Doctor Visits Discussed/Reviewed Doctor Visits Reviewed  Exercise Interventions   Exercise Discussed/Reviewed Physical Activity  Education Interventions   Education Provided Provided Education  [encouraged paitent to continue to take his medications as prescribed.  Encouraged patient to continue to use his CPAP as directed and call MD for any concerns.]  Provided Verbal Education On Mental Health/Coping with Illness  Mental Health Interventions   Mental Health Discussed/Reviewed Other  [offered a listening ear and congratulated patient on his progres.  He denies any additonal nursing needs at this time. Reminded patient he could call me if needed in the future.]  Nutrition Interventions   Nutrition Discussed/Reviewed Nutrition Discussed  [reviewed importance of a healthy diet and getting plenty of sleep]  Pharmacy Interventions   Pharmacy Dicussed/Reviewed Medications and their functions               SDOH assessments and interventions completed:  No     Care Coordination Interventions:  Yes, provided   Follow up plan: No further intervention required.   Encounter Outcome:  Patient Visit Completed   Lonia Chimera, RN, BSN, CEN [facility] Coordinator [phone]

## 2022-09-25 DIAGNOSIS — R400 Somnolence: Secondary | ICD-10-CM | POA: Diagnosis not present

## 2022-09-25 DIAGNOSIS — R5383 Other fatigue: Secondary | ICD-10-CM | POA: Diagnosis not present

## 2022-09-25 DIAGNOSIS — G4733 Obstructive sleep apnea (adult) (pediatric): Secondary | ICD-10-CM | POA: Diagnosis not present

## 2022-09-30 DIAGNOSIS — D1801 Hemangioma of skin and subcutaneous tissue: Secondary | ICD-10-CM | POA: Diagnosis not present

## 2022-09-30 DIAGNOSIS — L821 Other seborrheic keratosis: Secondary | ICD-10-CM | POA: Diagnosis not present

## 2022-09-30 DIAGNOSIS — D485 Neoplasm of uncertain behavior of skin: Secondary | ICD-10-CM | POA: Diagnosis not present

## 2022-09-30 DIAGNOSIS — D2261 Melanocytic nevi of right upper limb, including shoulder: Secondary | ICD-10-CM | POA: Diagnosis not present

## 2022-09-30 DIAGNOSIS — L814 Other melanin hyperpigmentation: Secondary | ICD-10-CM | POA: Diagnosis not present

## 2022-09-30 DIAGNOSIS — D225 Melanocytic nevi of trunk: Secondary | ICD-10-CM | POA: Diagnosis not present

## 2022-09-30 DIAGNOSIS — L820 Inflamed seborrheic keratosis: Secondary | ICD-10-CM | POA: Diagnosis not present

## 2022-09-30 DIAGNOSIS — D171 Benign lipomatous neoplasm of skin and subcutaneous tissue of trunk: Secondary | ICD-10-CM | POA: Diagnosis not present

## 2022-10-02 DIAGNOSIS — G4733 Obstructive sleep apnea (adult) (pediatric): Secondary | ICD-10-CM | POA: Diagnosis not present

## 2022-10-21 ENCOUNTER — Ambulatory Visit: Payer: Self-pay | Admitting: *Deleted

## 2022-10-21 NOTE — Patient Outreach (Signed)
Care Coordination   Follow Up Visit Note   10/21/2022 Name: David Chen MRN: [id number] DOB: 10/02/1950  David Chen is a 72 y.o. year old male who sees Street, Stephanie Coup, MD for primary care. I spoke with  David Chen by phone today.  What matters to the patients health and wellness today?  Pt reports he got puppy from a rescue and is coping well.    Goals Addressed             This Visit's Progress    COMPLETED: Improve health and mental health       Activities and task to complete in order to accomplish goals.   Consider to resume counseling if needs surface again Start / continue relaxed breathing 3 times daily Keep all upcoming appointment discussed today Continue with compliance of taking medication prescribed by Doctor Self Support options  (journaling, find ways to remember Mia, find ways to engage and remain active, distract self from negative thoughts through positive action) Continue to [place] Continue with your good work at eliminating alcohol use! Great work!! Call 988 for 24/7 contact with mental health support Call 911 for all emergency needs        SDOH assessments and interventions completed:  Yes  SDOH Interventions Today    Flowsheet Row Most Recent Value  SDOH Interventions   Depression Interventions/Treatment  Medication, Currently on Treatment, Counseling, PHQ2-9 Score <4 Follow-up Not Indicated        Care Coordination Interventions:  Yes, provided  Interventions Today    Flowsheet Row Most Recent Value  Chronic Disease   Chronic disease during today's visit Other  [grief/loss/depression]  Exercise Interventions   Exercise Discussed/Reviewed Exercise Discussed, Physical Activity  [Pt is active with his puppy as well as with hunting season tasks-]  Physical Activity Discussed/Reviewed Physical Activity Discussed  Education Interventions   Education Provided Provided Education   Provided Verbal Education On Mental Health/Coping with Illness  [CSW discussed pt't progress with his coping/depression and grief- he is managing well and denies further needs at this time. He has completed his counseling sessions also with Sam and will resume if needs arise.]  Mental Health Interventions   Mental Health Discussed/Reviewed Mental Health Discussed, Mental Health Reviewed, Depression, Grief and Loss, Coping Strategies  [Pt scored "0" today fpr depression symptoms and reports he is doing much better with his grief after losing his pet, Mia. He has recently rescued a puppy and is enjoying bonding and training "Karma Greaser and plans to have her join [place] the farm when hunting]         10/21/2022    9:06 AM 09/11/2022   10:25 AM 08/12/2022    9:10 AM 08/08/2022   11:16 AM  Depression screen PHQ 2/9  Decreased Interest 0 1 3 1   Down, Depressed, Hopeless 0 1 2 2   PHQ - 2 Score 0 2 5 3   Altered sleeping 0 1 2 2   Tired, decreased energy 0 0 2 2  Change in appetite 0 0 1 1  Feeling bad or failure about yourself  0 0 0 0  Trouble concentrating 0 0 3 2  Moving slowly or fidgety/restless 0 0 0 2  Suicidal thoughts 0 0 0 0  PHQ-9 Score 0 3 13 12   Difficult doing work/chores Not difficult at all Not difficult at all Somewhat difficult      Follow up plan: No further intervention required.  Encounter Outcome:  Patient Visit Completed

## 2022-10-21 NOTE — Patient Instructions (Signed)
Visit Information  Thank you for taking time to visit with me today. Please don't hesitate to contact me if I can be of assistance to you.   Following are the goals we discussed today:   Goals Addressed             This Visit's Progress    COMPLETED: Improve health and mental health       Activities and task to complete in order to accomplish goals.   Consider to resume counseling if needs surface again Start / continue relaxed breathing 3 times daily Keep all upcoming appointment discussed today Continue with compliance of taking medication prescribed by Doctor Self Support options  (journaling, find ways to remember Mia, find ways to engage and remain active, distract self from negative thoughts through positive action) Continue to participate in [place]~ Continue with your good work at eliminating alcohol use! Great work!! Call 988 for 24/7 contact with mental health support Call 911 for all emergency needs        Please call your PCP or the care guide team at [phone] if you need to schedule a follow up appointment.   If you are experiencing a Mental Health or Behavioral Health Crisis or need someone to talk to, please call the Suicide and Crisis Lifeline: 988 call 911   The patient verbalized understanding of instructions, educational materials, and care plan provided today and DECLINED offer to receive copy of patient instructions, educational materials, and care plan.   No further follow up required:    Reece Levy, MSW, LCSW Clinical Social Worker [phone]

## 2022-11-01 DIAGNOSIS — G4733 Obstructive sleep apnea (adult) (pediatric): Secondary | ICD-10-CM | POA: Diagnosis not present

## 2022-12-02 DIAGNOSIS — G4733 Obstructive sleep apnea (adult) (pediatric): Secondary | ICD-10-CM | POA: Diagnosis not present

## 2023-01-01 DIAGNOSIS — G4733 Obstructive sleep apnea (adult) (pediatric): Secondary | ICD-10-CM | POA: Diagnosis not present

## 2023-01-29 DIAGNOSIS — G4733 Obstructive sleep apnea (adult) (pediatric): Secondary | ICD-10-CM | POA: Diagnosis not present

## 2023-02-01 DIAGNOSIS — G4733 Obstructive sleep apnea (adult) (pediatric): Secondary | ICD-10-CM | POA: Diagnosis not present

## 2023-02-03 DIAGNOSIS — G4733 Obstructive sleep apnea (adult) (pediatric): Secondary | ICD-10-CM | POA: Diagnosis not present

## 2023-03-04 DIAGNOSIS — G4733 Obstructive sleep apnea (adult) (pediatric): Secondary | ICD-10-CM | POA: Diagnosis not present

## 2023-03-21 DIAGNOSIS — Z79899 Other long term (current) drug therapy: Secondary | ICD-10-CM | POA: Diagnosis not present

## 2023-03-21 DIAGNOSIS — Z0000 Encounter for general adult medical examination without abnormal findings: Secondary | ICD-10-CM | POA: Diagnosis not present

## 2023-03-21 DIAGNOSIS — K2960 Other gastritis without bleeding: Secondary | ICD-10-CM | POA: Diagnosis not present

## 2023-03-21 DIAGNOSIS — R7301 Impaired fasting glucose: Secondary | ICD-10-CM | POA: Diagnosis not present

## 2023-03-21 DIAGNOSIS — I10 Essential (primary) hypertension: Secondary | ICD-10-CM | POA: Diagnosis not present

## 2023-03-21 DIAGNOSIS — E785 Hyperlipidemia, unspecified: Secondary | ICD-10-CM | POA: Diagnosis not present

## 2023-03-21 DIAGNOSIS — Z125 Encounter for screening for malignant neoplasm of prostate: Secondary | ICD-10-CM | POA: Diagnosis not present

## 2023-03-21 DIAGNOSIS — D67 Hereditary factor IX deficiency: Secondary | ICD-10-CM | POA: Diagnosis not present

## 2023-03-21 DIAGNOSIS — M1991 Primary osteoarthritis, unspecified site: Secondary | ICD-10-CM | POA: Diagnosis not present

## 2023-03-21 DIAGNOSIS — M79672 Pain in left foot: Secondary | ICD-10-CM | POA: Diagnosis not present

## 2023-03-21 DIAGNOSIS — F3340 Major depressive disorder, recurrent, in remission, unspecified: Secondary | ICD-10-CM | POA: Diagnosis not present

## 2023-03-21 DIAGNOSIS — H539 Unspecified visual disturbance: Secondary | ICD-10-CM | POA: Diagnosis not present

## 2023-03-26 DIAGNOSIS — R400 Somnolence: Secondary | ICD-10-CM | POA: Diagnosis not present

## 2023-03-26 DIAGNOSIS — R5383 Other fatigue: Secondary | ICD-10-CM | POA: Diagnosis not present

## 2023-03-26 DIAGNOSIS — G4733 Obstructive sleep apnea (adult) (pediatric): Secondary | ICD-10-CM | POA: Diagnosis not present

## 2023-03-31 DIAGNOSIS — H538 Other visual disturbances: Secondary | ICD-10-CM | POA: Diagnosis not present

## 2023-03-31 DIAGNOSIS — I6523 Occlusion and stenosis of bilateral carotid arteries: Secondary | ICD-10-CM | POA: Diagnosis not present

## 2023-04-01 DIAGNOSIS — G4733 Obstructive sleep apnea (adult) (pediatric): Secondary | ICD-10-CM | POA: Diagnosis not present

## 2023-04-02 DIAGNOSIS — M7551 Bursitis of right shoulder: Secondary | ICD-10-CM | POA: Diagnosis not present

## 2023-04-02 DIAGNOSIS — M19011 Primary osteoarthritis, right shoulder: Secondary | ICD-10-CM | POA: Diagnosis not present

## 2023-04-02 DIAGNOSIS — M25511 Pain in right shoulder: Secondary | ICD-10-CM | POA: Diagnosis not present

## 2023-04-07 DIAGNOSIS — M79672 Pain in left foot: Secondary | ICD-10-CM | POA: Diagnosis not present

## 2023-04-07 DIAGNOSIS — M7752 Other enthesopathy of left foot: Secondary | ICD-10-CM | POA: Diagnosis not present

## 2023-04-08 DIAGNOSIS — E041 Nontoxic single thyroid nodule: Secondary | ICD-10-CM | POA: Diagnosis not present

## 2023-04-08 DIAGNOSIS — E042 Nontoxic multinodular goiter: Secondary | ICD-10-CM | POA: Diagnosis not present

## 2023-04-22 ENCOUNTER — Telehealth: Payer: Self-pay | Admitting: Oncology

## 2023-04-22 NOTE — Telephone Encounter (Signed)
 04/22/23 Spoke with patient and scheduled New hem appt with Dr Ciro Cress.

## 2023-05-02 DIAGNOSIS — G4733 Obstructive sleep apnea (adult) (pediatric): Secondary | ICD-10-CM | POA: Diagnosis not present

## 2023-05-09 ENCOUNTER — Inpatient Hospital Stay

## 2023-05-09 ENCOUNTER — Encounter: Payer: Self-pay | Admitting: Oncology

## 2023-05-09 ENCOUNTER — Inpatient Hospital Stay: Attending: Oncology | Admitting: Oncology

## 2023-05-09 ENCOUNTER — Other Ambulatory Visit: Payer: Self-pay | Admitting: Oncology

## 2023-05-09 VITALS — BP 128/72 | HR 54 | Temp 98.2°F | Resp 16 | Ht 66.5 in | Wt 182.4 lb

## 2023-05-09 DIAGNOSIS — D67 Hereditary factor IX deficiency: Secondary | ICD-10-CM

## 2023-05-09 DIAGNOSIS — D6851 Activated protein C resistance: Secondary | ICD-10-CM | POA: Insufficient documentation

## 2023-05-09 LAB — CMP (CANCER CENTER ONLY)
ALT: 29 U/L (ref 0–44)
AST: 22 U/L (ref 15–41)
Albumin: 4.2 g/dL (ref 3.5–5.0)
Alkaline Phosphatase: 77 U/L (ref 38–126)
Anion gap: 9 (ref 5–15)
BUN: 25 mg/dL — ABNORMAL HIGH (ref 8–23)
CO2: 27 mmol/L (ref 22–32)
Calcium: 9.8 mg/dL (ref 8.9–10.3)
Chloride: 105 mmol/L (ref 98–111)
Creatinine: 1.28 mg/dL — ABNORMAL HIGH (ref 0.61–1.24)
GFR, Estimated: 60 mL/min — ABNORMAL LOW (ref >60)
Glucose, Bld: 91 mg/dL (ref 70–99)
Potassium: 4.3 mmol/L (ref 3.5–5.1)
Sodium: 140 mmol/L (ref 135–145)
Total Bilirubin: 2.3 mg/dL — ABNORMAL HIGH (ref 0.0–1.2)
Total Protein: 6.7 g/dL (ref 6.5–8.1)

## 2023-05-09 LAB — CBC WITH DIFFERENTIAL (CANCER CENTER ONLY)
Abs Immature Granulocytes: 0.01 10*3/uL (ref 0.00–0.07)
Basophils Absolute: 0 10*3/uL (ref 0.0–0.1)
Basophils Relative: 1 %
Eosinophils Absolute: 0.1 10*3/uL (ref 0.0–0.5)
Eosinophils Relative: 2 %
HCT: 45.2 % (ref 39.0–52.0)
Hemoglobin: 15.3 g/dL (ref 13.0–17.0)
Immature Granulocytes: 0 %
Lymphocytes Relative: 28 %
Lymphs Abs: 1.4 10*3/uL (ref 0.7–4.0)
MCH: 30.4 pg (ref 26.0–34.0)
MCHC: 33.8 g/dL (ref 30.0–36.0)
MCV: 89.9 fL (ref 80.0–100.0)
Monocytes Absolute: 0.5 10*3/uL (ref 0.1–1.0)
Monocytes Relative: 10 %
Neutro Abs: 3 10*3/uL (ref 1.7–7.7)
Neutrophils Relative %: 59 %
Platelet Count: 233 10*3/uL (ref 150–400)
RBC: 5.03 MIL/uL (ref 4.22–5.81)
RDW: 12.9 % (ref 11.5–15.5)
WBC Count: 5 10*3/uL (ref 4.0–10.5)
nRBC: 0 % (ref 0.0–0.2)
nRBC: 0 /100{WBCs}

## 2023-05-09 LAB — APTT: aPTT: 28 s (ref 24–36)

## 2023-05-09 LAB — PROTIME-INR
INR: 0.9 (ref 0.8–1.2)
Prothrombin Time: 12.5 s (ref 11.4–15.2)

## 2023-05-09 NOTE — Progress Notes (Signed)
 [name]  [address] [place],  [place]  [address] [phone]  Clinic Day: 05/09/2023  Referring physician: Freya Jesus, *   HISTORY OF PRESENT ILLNESS:  The patient is a 73 y.o. male  who I was asked to consult upon for having factor IX deficiency (hemophilia B).  He particularly comes in today as he is scheduled for thyroid  biopsy in 2 weeks.  Before this biopsy is done, the concern is whether he needs factor IX replacement to prevent any periprocedural bleeding from occurring in bleedings or problems.  With respect to his factor IX deficiency, this was diagnosed back in 2014.  He recalls having prolonged bleeds after cuts.  He also recalls having a severe bleed as a child after undergoing a tonsillectomy.  He also had a vasectomy 35 years ago, which led to prolonged bleeding.  A colonoscopy, which led to a polyp being removed, also led to prolonged bleeding.  The patient did not receive factor IX replacement with no surgery/procedures.  However, he did receive factor IX replacement after cholecystectomy in October 2014.  He does not recall there being any bleeding complications afterwards.  The patient denies having a history of spontaneous hemarthroses.  However, he recalls having a torn hamstring 30 years ago, which led to his leg being black in appearance.  To his knowledge, the only other family member with factor IX deficiency is a nephew.  PAST MEDICAL HISTORY:   Past Medical History:  Diagnosis Date   Arthritis    lower back but not diagnosed per pt   Asthma    Clotting disorder (HCC)    FACTOR IX DEFICIENCY   Diverticulosis    Factor IX deficiency (HCC)    High cholesterol    Hx of adenomatous colonic polyps    Hypertension    does not see a cardiologist   Inguinal hernia    on the right side- not causing issues , no pain per pt 09-11-2018   Sleep apnea    Sleep study done at [facility] sleep center in [facility]- no cpap      PAST SURGICAL HISTORY:   Past Surgical History:  Procedure Laterality Date   CHOLECYSTECTOMY  01/18/2011   Procedure: LAPAROSCOPIC CHOLECYSTECTOMY WITH INTRAOPERATIVE CHOLANGIOGRAM;  Surgeon: Harlee Lichtenstein, MD;  Location: [facility] OR;  Service: General;  Laterality: N/A;   COLONOSCOPY  2006, 2-3 years ago   had issues with bleeding   POLYPECTOMY     TONSILLECTOMY     VASECTOMY      CURRENT MEDICATIONS:   Current Outpatient Medications  Medication Sig Dispense Refill   Apple Cider Vinegar 500 MG TABS Take 2 tablets by mouth daily.     atorvastatin (LIPITOR) 40 MG tablet Take 40 mg by mouth 2 (two) times a week.      escitalopram (LEXAPRO) 10 MG tablet Take 10 mg by mouth daily.     LORazepam (ATIVAN) 0.5 MG tablet Take 0.5 mg by mouth every 8 (eight) hours as needed for anxiety.     MELATONIN GUMMIES PO Take 10 mg by mouth at bedtime.     meloxicam (MOBIC) 15 MG tablet Take 15 mg by mouth as needed for pain.     telmisartan-hydrochlorothiazide (MICARDIS HCT) 80-12.5 MG tablet Take 1 tablet by mouth daily.     No current facility-administered medications for this visit.    ALLERGIES:   Allergies  Allergen Reactions   Penicillins Hives    Did it  involve swelling of the face/tongue/throat, SOB, or low BP? Yes Did it involve sudden or severe rash/hives, skin peeling, or any reaction on the inside of your mouth or nose? No Did you need to seek medical attention at a hospital or doctor's office? No When did it last happen?      recent If all above answers are "NO", may proceed with cephalosporin use.     FAMILY HISTORY:   Family History  Problem Relation Age of Onset   Breast cancer Mother    Bladder Cancer Mother    Lung cancer Mother    Hypertension Father    Colon cancer Neg Hx    Pancreatic cancer Neg Hx    Stomach cancer Neg Hx    Esophageal cancer Neg Hx    Diabetes Neg Hx    Heart disease Neg Hx    Rectal cancer Neg Hx    Colon polyps Neg Hx     SOCIAL  HISTORY:  The patient was born and raised in [place].  He lives [place] town with his wife of 40 years.  They have 1 child and 2 grandchildren.  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  He did smoke up to 2 packs of cigarettes daily for 15 years before quitting numerous years ago.  He still drinks a few beers weekly.  REVIEW OF SYSTEMS:  Review of Systems  Constitutional:  Negative for fatigue, fever and unexpected weight change.  Eyes:  Positive for eye problems.  Respiratory:  Negative for chest tightness, cough, hemoptysis and shortness of breath.   Cardiovascular:  Negative for chest pain and palpitations.  Gastrointestinal:  Negative for abdominal distention, abdominal pain, blood in stool, constipation, diarrhea, nausea and vomiting.  Genitourinary:  Negative for dysuria, frequency and hematuria.   Musculoskeletal:  Positive for arthralgias. Negative for back pain and myalgias.  Skin:  Negative for itching and rash.  Neurological:  Negative for dizziness, headaches and light-headedness.  Psychiatric/Behavioral:  Negative for depression and suicidal ideas. The patient is not nervous/anxious.     PHYSICAL EXAM:  Blood pressure 128/72, pulse (!) 54, temperature 98.2 F (36.8 C), temperature source Oral, resp. rate 16, height 5' 6.5" (1.689 m), weight 182 lb 6.4 oz (82.7 kg), SpO2 100%. Wt Readings from Last 3 Encounters:  05/09/23 182 lb 6.4 oz (82.7 kg)  08/08/22 178 lb (80.7 kg)  09/25/18 180 lb (81.6 kg)   Body mass index is 29 kg/m. Performance status (ECOG): 0 - Asymptomatic Physical Exam Constitutional:      Appearance: Normal appearance. He is not ill-appearing.  HENT:     Mouth/Throat:     Mouth: Mucous membranes are moist.     Pharynx: Oropharynx is clear. No oropharyngeal exudate or posterior oropharyngeal erythema.  Cardiovascular:     Rate and Rhythm: Normal rate and regular rhythm.     Heart sounds: No  murmur heard.    No friction rub. No gallop.  Pulmonary:     Effort: Pulmonary effort is normal. No respiratory distress.     Breath sounds: Normal breath sounds. No wheezing, rhonchi or rales.  Abdominal:     General: Bowel sounds are normal. There is no distension.     Palpations: Abdomen is soft. There is no mass.     Tenderness: There is no abdominal tenderness.  Musculoskeletal:        General: No swelling.     Right lower leg:  No edema.     Left lower leg: No edema.  Lymphadenopathy:     Cervical: No cervical adenopathy.     Upper Body:     Right upper body: No supraclavicular or axillary adenopathy.     Left upper body: No supraclavicular or axillary adenopathy.     Lower Body: No right inguinal adenopathy. No left inguinal adenopathy.  Skin:    General: Skin is warm.     Coloration: Skin is not jaundiced.     Findings: No lesion or rash.  Neurological:     General: No focal deficit present.     Mental Status: He is alert and oriented to person, place, and time. Mental status is at baseline.  Psychiatric:        Mood and Affect: Mood normal.        Behavior: Behavior normal.        Thought Content: Thought content normal.     LABS:      Latest Ref Rng & Units 05/09/2023   10:30 AM 09/28/2018    3:45 AM 09/28/2018    3:44 AM  CBC  WBC 4.0 - 10.5 K/uL 5.0   5.7   Hemoglobin 13.0 - 17.0 g/dL 09.8  11.9  14.7   Hematocrit 39.0 - 52.0 % 45.2  45.1  43.7   Platelets 150 - 400 K/uL 233   244       Latest Ref Rng & Units 05/09/2023   10:30 AM 09/28/2018    3:44 AM 09/27/2018    1:22 PM  CMP  Glucose 70 - 99 mg/dL 91  829  98   BUN 8 - 23 mg/dL 25  16  18    Creatinine 0.61 - 1.24 mg/dL 5.62  1.30  8.65   Sodium 135 - 145 mmol/L 140  142  137   Potassium 3.5 - 5.1 mmol/L 4.3  3.8  4.0   Chloride 98 - 111 mmol/L 105  111  104   CO2 22 - 32 mmol/L 27  22  26    Calcium 8.9 - 10.3 mg/dL 9.8  9.2  9.3   Total Protein 6.5 - 8.1 g/dL 6.7  6.5    Total Bilirubin 0.0 - 1.2  mg/dL 2.3  2.7    Alkaline Phos 38 - 126 U/L 77  58    AST 15 - 41 U/L 22  64    ALT 0 - 44 U/L 29  67      Latest Reference Range & Units 05/09/23 10:30  Coagulation Factor IX  60 - 177 % 12 (L)  (L): Data is abnormally low  Factor IX Activity             14          [L ] %        UY   Reference Range: 17 years and older: 38 - 177   Factor IX Bethesda Titer       <0.8             Bethesda UY     ASSESSMENT & PLAN:  A 73 y.o. male who I was asked to consult upon for factor IX deficiency (hemophilia B).  As the patient is getting a thyroid  biopsy, I do believe the wise thing to do is replace his factor IX level to where it is at least at 50-70 units at the time this biopsy is done.  An absence of Bethesda units done shows that this gentleman  does not have any type of factor IX inhibitor.  This obviates the need for FEIBA therapy being used.  The patient will receive pure factor IX replacement therapy, which will be given approximately 1-2 hours before his thyroid  biopsy.  Pharmacy also states they will likely order a similar dose of factor IX just in case postprocedural complications develop to where more factor replacement is necessary.  This gentleman's thyroid  biopsy is scheduled for May 16th.  All of the logistics, with respect to where he will receive his factor IX replacement therapy before his thyroid  biopsy, are currently being formulated.  Otherwise, as this gentleman has no other pressing hematologic issues, I will turn his care back over to his other physicians.  The patient understands all the plans discussed today and is in agreement with them.  I do appreciate Street, Renford Cartwright, * for his new consult.   Purcell Jungbluth Felicia Horde, MD

## 2023-05-12 ENCOUNTER — Other Ambulatory Visit: Payer: Self-pay | Admitting: Oncology

## 2023-05-12 DIAGNOSIS — D67 Hereditary factor IX deficiency: Secondary | ICD-10-CM

## 2023-05-12 LAB — FACTOR 9 ASSAY: Coagulation Factor IX: 12 % — ABNORMAL LOW (ref 60–177)

## 2023-05-13 ENCOUNTER — Telehealth: Payer: Self-pay | Admitting: Oncology

## 2023-05-13 ENCOUNTER — Inpatient Hospital Stay

## 2023-05-13 DIAGNOSIS — G4733 Obstructive sleep apnea (adult) (pediatric): Secondary | ICD-10-CM | POA: Diagnosis not present

## 2023-05-13 DIAGNOSIS — D6851 Activated protein C resistance: Secondary | ICD-10-CM | POA: Diagnosis not present

## 2023-05-13 DIAGNOSIS — D67 Hereditary factor IX deficiency: Secondary | ICD-10-CM

## 2023-05-13 NOTE — Telephone Encounter (Signed)
 05/13/2023  Pt performed e-check in on 4/28 for the month of may per patient

## 2023-05-19 LAB — MISC LABCORP TEST (SEND OUT): Labcorp test code: 500443

## 2023-05-23 DIAGNOSIS — E041 Nontoxic single thyroid nodule: Secondary | ICD-10-CM | POA: Diagnosis not present

## 2023-07-18 ENCOUNTER — Telehealth: Payer: Self-pay | Admitting: Gastroenterology

## 2023-07-18 NOTE — Telephone Encounter (Signed)
 Inbound call from patient wanting to schedule colonoscopy but patient is under the impression he needs to be done at the hospital.   Please advise.

## 2023-07-20 NOTE — Telephone Encounter (Signed)
 Because he has hemophilia B, he had bleeding from a small polypectomy site after his September 2020 colonoscopy in the [facility].  Therefore I think it would be best if his next colonoscopy was done in the hospital outpatient endoscopy lab in case clips or other hemostatic treatments are needed for any polypectomy sites.  He can be directly booked for my next available hospital-based colonoscopy.  As it happens because of a cancellation, I have time available in my [place] long schedule on 07/28/2023.  I understand that his short notice and perhaps he cannot accommodate that.  But if he can, then I am also agreeable to seeing him at an open slot I have available in clinic this coming Friday the 18th for evaluation and to be given all necessary prep instructions etc.  If he cannot do July 21, then I believe my next available hospital endoscopy slot is in September.  VEAR Brand MD

## 2023-07-25 NOTE — Telephone Encounter (Signed)
 Cancelled patient pv and procedure for lec. Please advise on previous note.   Thank you

## 2023-07-25 NOTE — Telephone Encounter (Signed)
 Attempted to reach patient. No answer, left VM for patient to return call.

## 2023-07-28 NOTE — Telephone Encounter (Signed)
 Spoke with patient. Let him know that his appointment will need to be scheduled at the hospital. Advised patient that the dates that Dr. Legrand currently has are booked out. October scheduled is not currently out. Advised patient that I will call him back when the October schedule comes out. Patient verbalized understanding.

## 2023-07-30 ENCOUNTER — Other Ambulatory Visit: Payer: Self-pay

## 2023-07-30 DIAGNOSIS — Z860100 Personal history of colon polyps, unspecified: Secondary | ICD-10-CM

## 2023-07-30 MED ORDER — NA SULFATE-K SULFATE-MG SULF 17.5-3.13-1.6 GM/177ML PO SOLN: 1.0000 | Freq: Once | ORAL | 0 refills | Status: AC

## 2023-07-30 NOTE — Addendum Note (Signed)
 Addended by: NICHOLAUS JARVIS on: 07/30/2023 10:26 AM   Modules accepted: Orders

## 2023-07-30 NOTE — Telephone Encounter (Addendum)
 October schedule released. Called patient. Colonoscopy appointment scheduled for 10/20/23. Start time 07:30 Arrival time 06:00.patient is not diabetic nor is he on blood thinners. Instructions printed and placed in mail. Case ID # [id number]

## 2023-08-18 DIAGNOSIS — J40 Bronchitis, not specified as acute or chronic: Secondary | ICD-10-CM | POA: Diagnosis not present

## 2023-08-18 DIAGNOSIS — J329 Chronic sinusitis, unspecified: Secondary | ICD-10-CM | POA: Diagnosis not present

## 2023-08-18 DIAGNOSIS — Z6828 Body mass index (BMI) 28.0-28.9, adult: Secondary | ICD-10-CM | POA: Diagnosis not present

## 2023-08-25 ENCOUNTER — Encounter

## 2023-08-27 DIAGNOSIS — G4733 Obstructive sleep apnea (adult) (pediatric): Secondary | ICD-10-CM | POA: Diagnosis not present

## 2023-09-06 DIAGNOSIS — R0600 Dyspnea, unspecified: Secondary | ICD-10-CM | POA: Diagnosis not present

## 2023-09-09 DIAGNOSIS — I10 Essential (primary) hypertension: Secondary | ICD-10-CM | POA: Diagnosis not present

## 2023-09-09 DIAGNOSIS — Z6827 Body mass index (BMI) 27.0-27.9, adult: Secondary | ICD-10-CM | POA: Diagnosis not present

## 2023-09-09 DIAGNOSIS — J329 Chronic sinusitis, unspecified: Secondary | ICD-10-CM | POA: Diagnosis not present

## 2023-09-09 DIAGNOSIS — R053 Chronic cough: Secondary | ICD-10-CM | POA: Diagnosis not present

## 2023-09-09 DIAGNOSIS — J40 Bronchitis, not specified as acute or chronic: Secondary | ICD-10-CM | POA: Diagnosis not present

## 2023-09-09 DIAGNOSIS — Z801 Family history of malignant neoplasm of trachea, bronchus and lung: Secondary | ICD-10-CM | POA: Diagnosis not present

## 2023-09-17 ENCOUNTER — Encounter: Admitting: Gastroenterology

## 2023-09-18 DIAGNOSIS — R053 Chronic cough: Secondary | ICD-10-CM | POA: Diagnosis not present

## 2023-09-19 DIAGNOSIS — R053 Chronic cough: Secondary | ICD-10-CM | POA: Diagnosis not present

## 2023-09-23 ENCOUNTER — Other Ambulatory Visit: Payer: Self-pay

## 2023-09-23 DIAGNOSIS — M1990 Unspecified osteoarthritis, unspecified site: Secondary | ICD-10-CM | POA: Insufficient documentation

## 2023-09-23 DIAGNOSIS — G4730 Sleep apnea, unspecified: Secondary | ICD-10-CM | POA: Insufficient documentation

## 2023-09-23 DIAGNOSIS — D689 Coagulation defect, unspecified: Secondary | ICD-10-CM | POA: Insufficient documentation

## 2023-09-23 DIAGNOSIS — Z6827 Body mass index (BMI) 27.0-27.9, adult: Secondary | ICD-10-CM | POA: Insufficient documentation

## 2023-09-23 DIAGNOSIS — Z860101 Personal history of adenomatous and serrated colon polyps: Secondary | ICD-10-CM | POA: Insufficient documentation

## 2023-09-23 DIAGNOSIS — K5790 Diverticulosis of intestine, part unspecified, without perforation or abscess without bleeding: Secondary | ICD-10-CM | POA: Insufficient documentation

## 2023-09-23 DIAGNOSIS — K4090 Unilateral inguinal hernia, without obstruction or gangrene, not specified as recurrent: Secondary | ICD-10-CM | POA: Insufficient documentation

## 2023-09-23 DIAGNOSIS — Z801 Family history of malignant neoplasm of trachea, bronchus and lung: Secondary | ICD-10-CM | POA: Insufficient documentation

## 2023-09-23 DIAGNOSIS — E7800 Pure hypercholesterolemia, unspecified: Secondary | ICD-10-CM | POA: Insufficient documentation

## 2023-09-23 DIAGNOSIS — I10 Essential (primary) hypertension: Secondary | ICD-10-CM | POA: Insufficient documentation

## 2023-09-23 DIAGNOSIS — E785 Hyperlipidemia, unspecified: Secondary | ICD-10-CM | POA: Insufficient documentation

## 2023-09-24 ENCOUNTER — Ambulatory Visit

## 2023-09-24 VITALS — BP 110/70 | HR 78 | Ht 65.6 in | Wt 171.2 lb

## 2023-09-24 DIAGNOSIS — E782 Mixed hyperlipidemia: Secondary | ICD-10-CM

## 2023-09-24 DIAGNOSIS — I10 Essential (primary) hypertension: Secondary | ICD-10-CM | POA: Diagnosis not present

## 2023-09-24 DIAGNOSIS — I2510 Atherosclerotic heart disease of native coronary artery without angina pectoris: Secondary | ICD-10-CM | POA: Diagnosis not present

## 2023-09-24 DIAGNOSIS — R0609 Other forms of dyspnea: Secondary | ICD-10-CM | POA: Diagnosis not present

## 2023-09-24 MED ORDER — METOPROLOL TARTRATE 100 MG PO TABS: 100.0000 mg | ORAL_TABLET | Freq: Once | ORAL | 0 refills | Status: DC

## 2023-09-24 MED ORDER — ATORVASTATIN CALCIUM 40 MG PO TABS
ORAL_TABLET | ORAL | 3 refills | Status: AC
Start: 2023-09-24 — End: ?

## 2023-09-24 NOTE — Assessment & Plan Note (Signed)
 Symptoms of moderate exertion related dyspnea. Associated with cough and wheezing over the last few weeks which has improved with inhaler therapy and steroids.  However does have symptoms going on chronically. Now with significant coronary atherosclerosis and significant hyperlipidemia history, proceed with further evaluation of cardiac causes.  Proceed with transthoracic echocardiogram to assess cardiac structure and function. Proceed with cardiac CTA coronary angiogram to rule out any obstructive disease in the setting of severe coronary atherosclerosis noted.

## 2023-09-24 NOTE — Assessment & Plan Note (Signed)
 With severe coronary atherosclerosis, risk factors with hyperlipidemia. Now with moderate exertion causing dyspnea. Likely pulmonary causes but cannot entirely exclude underlying obstructive CAD.  Proceed with cardiac CT coronary angiogram to rule out any significant obstructive disease. Continue with lipid-lowering therapy as discussed under hyperlipidemia.  Mention he has taken aspirin in the past without significant side effects but only on an as-needed basis. Given his hemophilia history if he does have obstructive CAD history we will start low-dose aspirin and have further review with hematologist.

## 2023-09-24 NOTE — Assessment & Plan Note (Signed)
 Last lipid panel from March 2025 reviewed. Will repeat fasting lipid panel. Target LDL below 70 mg/dL. Advised him to increase atorvastatin  dose to 40 mg 5 times a week. He is agreeable.

## 2023-09-24 NOTE — Assessment & Plan Note (Addendum)
 Well-controlled on current regimen with telmisartan-hydrochlorothiazide 80 mg - 12.5 mg once daily.  Continue the same. Target below 130/80 mmHg.

## 2023-09-24 NOTE — Patient Instructions (Signed)
 Medication Instructions:  Your physician has recommended you make the following change in your medication:   START: Lipitor 40 mg 5 times per week  *If you need a refill on your cardiac medications before your next appointment, please call your pharmacy*  Lab Work: Your physician recommends that you return for lab work in:   Labs tomorrow: CMP, CBC, Lipids  If you have labs (blood work) drawn today and your tests are completely normal, you will receive your results only by: MyChart Message (if you have MyChart) OR A paper copy in the mail If you have any lab test that is abnormal or we need to change your treatment, we will call you to review the results.  Testing/Procedures: Your physician has requested that you have an echocardiogram. Echocardiography is a painless test that uses sound waves to create images of your heart. It provides your doctor with information about the size and shape of your heart and how well your heart's chambers and valves are working. This procedure takes approximately one hour. There are no restrictions for this procedure. Please do NOT wear cologne, perfume, aftershave, or lotions (deodorant is allowed). Please arrive 15 minutes prior to your appointment time.  Please note: We ask at that you not bring children with you during ultrasound (echo/ vascular) testing. Due to room size and safety concerns, children are not allowed in the ultrasound rooms during exams. Our front office staff cannot provide observation of children in our lobby area while testing is being conducted. An adult accompanying a patient to their appointment will only be allowed in the ultrasound room at the discretion of the ultrasound technician under special circumstances. We apologize for any inconvenience.    Your cardiac CT will be scheduled at one of the below locations:   [facility] [address] [place], [place] [address] [phone] (Severe contrast allergies  only)  OR   [facility] [address] [place], [place] [address] [phone]  OR   [facility] [address] [place], [place] [address] [phone]  OR   [facility] and Vascular Tower [address]  [place], [place] [address] [phone]  OR   [facility] [address] [place], [place] [phone]  If scheduled at [facility], please arrive at the [facility] and Children's Entrance (Entrance C2) of [facility] 30 minutes prior to test start time. You can use the FREE valet parking offered at entrance C (encouraged to control the heart rate for the test)  Proceed to the [facility] Radiology Department (first floor) to check-in and test prep.  All radiology patients and guests should use entrance C2 at [facility], accessed from [facility], even though the hospital's physical address listed is [address].  If scheduled at the Heart and Vascular Tower at [address], please enter the parking lot using the [address] entrance and use the FREE valet service at the patient drop-off area. Enter the building and check-in with registration on the main floor.  If scheduled at [facility], please arrive to the Heart and Vascular Center 15 mins early for check-in and test prep.  There is spacious parking and easy access to the radiology department from the [facility] Heart and Vascular entrance. Please enter here and check-in with the desk attendant.   If scheduled at [facility], please arrive 30 minutes early for check-in and test prep.  Please follow these instructions carefully (unless otherwise directed):  An IV will be required for this test and Nitroglycerin will be given.  Hold all erectile dysfunction medications at least 3 days (72 hrs) prior to test. (Ie viagra, cialis, sildenafil, tadalafil, etc)   On the Night Before the  Test: Be sure to Drink plenty of water. Do not consume any caffeinated/decaffeinated beverages or chocolate 12 hours prior to your test. Do not take any antihistamines 12 hours prior to your test.  On the Day of the Test: Drink plenty of water until 1 hour prior to the test. Do not eat any food 1 hour prior to test. You may take your regular medications prior to the test.  Take metoprolol  (Lopressor ) two hours prior to test. If you take Micardis please HOLD on the morning of the test. Patients who wear a continuous glucose monitor MUST remove the device prior to scanning.      After the Test: Drink plenty of water. After receiving IV contrast, you may experience a mild flushed feeling. This is normal. On occasion, you may experience a mild rash up to 24 hours after the test. This is not dangerous. If this occurs, you can take Benadryl 25 mg, Zyrtec, Claritin, or Allegra and increase your fluid intake. (Patients taking Tikosyn should avoid Benadryl, and may take Zyrtec, Claritin, or Allegra) If you experience trouble breathing, this can be serious. If it is severe call 911 IMMEDIATELY. If it is mild, please call our office.  We will call to schedule your test 2-4 weeks out understanding that some insurance companies will need an authorization prior to the service being performed.   For more information and frequently asked questions, please visit our website : [email or web address]  For non-scheduling related questions, please contact the cardiac imaging nurse navigator should you have any questions/concerns: Cardiac Imaging Nurse Navigators Direct Office Dial: [phone]   For scheduling needs, including cancellations and rescheduling, please call [place], [phone].   Follow-Up: At [facility], you and your health needs are our priority.  As part of our continuing mission to provide you with exceptional heart care, our providers are all part of one team.   This team includes your primary Cardiologist (physician) and Advanced Practice Providers or APPs (Physician Assistants and Nurse Practitioners) who all work together to provide you with the care you need, when you need it.  Your next appointment:   3 month(s)  Provider:   Alean Kobus, MD    We recommend signing up for the patient portal called MyChart.  Sign up information is provided on this After Visit Summary.  MyChart is used to connect with patients for Virtual Visits (Telemedicine).  Patients are able to view lab/test results, encounter notes, upcoming appointments, etc.  Non-urgent messages can be sent to your provider as well.   To learn more about what you can do with MyChart, go to [email or web address].   Other Instructions None

## 2023-09-24 NOTE — Progress Notes (Signed)
 Cardiology Consultation:    Date:  09/24/2023   ID:  DVID PENDRY, DOB [birth year], MRN [id number]  PCP:  Street, Lonni HERO, MD  Cardiologist:  Alean SAUNDERS David Ayub, MD   Referring MD: David Chen, *   No chief complaint on file.    ASSESSMENT AND PLAN:   [name] 73 year old male Coronary atherosclerosis noted on CT chest September 2025, former smoker, 2 or 3 beers per week alcohol consumption, hypertension, hyperlipidemia, takes Lipitor twice weekly due to concerns about bleeding disorder, GERD, obesity, depression, factor IX deficiency, thyroid  nodule OSA-using CPAP.  Here for further evaluation in the setting of severe coronary atherosclerosis noted and reports symptoms of shortness of breath with moderate exertion somewhat improved with his pulmonary issues being addressed with inhalers and steroids.   Problem List Items Addressed This Visit     Essential hypertension   Well-controlled on current regimen with telmisartan-hydrochlorothiazide 80 mg - 12.5 mg once daily.  Continue the same. Target below 130/80 mmHg.       Relevant Medications   atorvastatin  (LIPITOR) 40 MG tablet   metoprolol  tartrate (LOPRESSOR ) 100 MG tablet   Hyperlipidemia   Last lipid panel from March 2025 reviewed. Will repeat fasting lipid panel. Target LDL below 70 mg/dL. Advised him to increase atorvastatin  dose to 40 mg 5 times a week. He is agreeable.        Relevant Medications   atorvastatin  (LIPITOR) 40 MG tablet   metoprolol  tartrate (LOPRESSOR ) 100 MG tablet   Other Relevant Orders   Lipid Profile   Dyspnea on exertion   Symptoms of moderate exertion related dyspnea. Associated with cough and wheezing over the last few weeks which has improved with inhaler therapy and steroids.  However does have symptoms going on chronically. Now with significant coronary atherosclerosis and significant hyperlipidemia history, proceed with further evaluation of cardiac  causes.  Proceed with transthoracic echocardiogram to assess cardiac structure and function. Proceed with cardiac CTA coronary angiogram to rule out any obstructive disease in the setting of severe coronary atherosclerosis noted.        Relevant Medications   metoprolol  tartrate (LOPRESSOR ) 100 MG tablet   Other Relevant Orders   EKG 12-Lead (Completed)   Comp Met (CMET)   CBC   CT CORONARY MORPH W/CTA COR W/SCORE W/CA W/CM &/OR WO/CM   ECHOCARDIOGRAM COMPLETE   Coronary atherosclerosis - Primary   With severe coronary atherosclerosis, risk factors with hyperlipidemia. Now with moderate exertion causing dyspnea. Likely pulmonary causes but cannot entirely exclude underlying obstructive CAD.  Proceed with cardiac CT coronary angiogram to rule out any significant obstructive disease. Continue with lipid-lowering therapy as discussed under hyperlipidemia.  Mention he has taken aspirin in the past without significant side effects but only on an as-needed basis. Given his hemophilia history if he does have obstructive CAD history we will start low-dose aspirin and have further review with hematologist.       Relevant Medications   atorvastatin  (LIPITOR) 40 MG tablet   metoprolol  tartrate (LOPRESSOR ) 100 MG tablet   Other Relevant Orders   EKG 12-Lead (Completed)   CT CORONARY MORPH W/CTA COR W/SCORE W/CA W/CM &/OR WO/CM  Return to clinic tentatively in 2 to 3 months.    History of Present Illness:    David Chen is a 73 y.o. male who is being seen today for the evaluation of coronary atherosclerosis noted on CT chest from September 2025 at [facility], at the request of Street,  Lonni Chen, *.  Here for the visit today by himself.  Lives with his wife at home.  [place] keeps [facility] [employer] [facility] and during hunting season he does more outdoor activities and less [facility] [employer].  Coronary atherosclerosis noted on CT chest  September 2025, former smoker, 2 or 3 beers per week alcohol consumption, hypertension, hyperlipidemia, takes Lipitor twice weekly due to concerns about bleeding disorder, GERD, obesity, depression, factor IX deficiency, thyroid  nodule OSA-using CPAP.  He had been dealing with cough symptoms for the last month or more associated with mild sputum production.  Symptoms improved after steroids and albuterol inhaler.  Does continue to have mild symptoms.  Recently had CT chest 09/12/2023 at [facility] for chronic cough assessment which reported ascending aorta size 3.5 cm.  Mild aortic atherosclerosis and severe coronary atherosclerosis reported.  Now in the setting of coronary atherosclerosis he is referred for further evaluation. Mentions he does get short of breath with moderate exertion. No significant symptoms of chest pain.  Does have significant family history of hyperlipidemia and his numbers he mentioned with elevated over 300s. Last lipid panel from 03/21/2023 total cholesterol 170, HDL 44, triglycerides 54.  LDL level not available. Has been only able to take atorvastatin  2 times a week due to concerns about muscle aches.  No blood in urine or stools. No pedal edema.  Former smoker [smoked from ages 8 through 47]. Drinks alcohol 2 or 3 beers a week. No illicit drug use.  EKG in the clinic today shows sinus rhythm heart rate 78/min, PR interval 174 ms, incomplete RBBB QRS morphology 104 ms duration, no ischemic changes.  QTc normal 414 ms. Past Medical History:  Diagnosis Date   Arthritis    lower back but not diagnosed per pt   Benign essential hypertension    BMI 27.0-27.9,adult    Clotting disorder (HCC)    FACTOR IX DEFICIENCY   Diverticulosis    Epigastric abdominal pain 11/21/2010   Essential hypertension 01/27/2008   Qualifier: Diagnosis of   By: Drucilla BANANA, Pam         Factor IX deficiency (HCC)    Family history of lung cancer    GIB (gastrointestinal  bleeding) 09/27/2018   HEMORRHOIDS, INTERNAL 09/14/2004   Qualifier: Diagnosis of   By: Drucilla BANANA, Pam      Replacing diagnoses that were inactivated after the 04/08/22 regulatory import     HEMORRHOIDS-EXTERNAL 01/27/2008   Qualifier: Diagnosis of   By: Kerman NP, Vina      Replacing diagnoses that were inactivated after the 04/08/22 regulatory import     High cholesterol    History of colonic polyps 07/12/2013   Adenomatous polyp 2006 and 2010  IMO SNOMED Dx Update Oct 2024     Hx of adenomatous colonic polyps    Hypertension    does not see a cardiologist   Inguinal hernia    on the right side- not causing issues , no pain per pt 09-11-2018   RECTAL BLEEDING 01/27/2008   Qualifier: Diagnosis of   By: Kerman NP, Paula         Sleep apnea    Sleep study done at [facility] sleep center in [facility]- no cpap     Past Surgical History:  Procedure Laterality Date   CHOLECYSTECTOMY  01/18/2011   Procedure: LAPAROSCOPIC CHOLECYSTECTOMY WITH INTRAOPERATIVE CHOLANGIOGRAM;  Surgeon: Krystal JINNY Russell, MD;  Location: [facility] OR;  Service: General;  Laterality:  N/A;   COLONOSCOPY  2006, 2-3 years ago   had issues with bleeding   POLYPECTOMY     TONSILLECTOMY     VASECTOMY      Current Medications: Current Meds  Medication Sig   Apple Cider Vinegar 500 MG TABS Take 1 tablet by mouth daily.   atorvastatin  (LIPITOR) 40 MG tablet Please take this medication 5 times per week.   LORazepam (ATIVAN) 0.5 MG tablet Take 0.5 mg by mouth every 8 (eight) hours as needed for anxiety.   MELATONIN GUMMIES PO Take 30 mg by mouth at bedtime.   meloxicam (MOBIC) 15 MG tablet Take 15 mg by mouth as needed for pain.   metoprolol  tartrate (LOPRESSOR ) 100 MG tablet Take 1 tablet (100 mg total) by mouth once for 1 dose. Please take this medication 2 hours before CT.   telmisartan-hydrochlorothiazide (MICARDIS HCT) 80-12.5 MG tablet Take 1 tablet by mouth daily.   [DISCONTINUED] atorvastatin  (LIPITOR) 40 MG  tablet Take 40 mg by mouth 2 (two) times a week.      Allergies:   Penicillins   Social History   Socioeconomic History   Marital status: Married    Spouse name: Romero   Number of children: 1   Years of education: 12+ 4   Highest education level: Not on file  Occupational History   Not on file  Tobacco Use   Smoking status: Former    Types: Cigarettes   Smokeless tobacco: Never  Vaping Use   Vaping status: Never Used  Substance and Sexual Activity   Alcohol use: Yes    Alcohol/week: 6.0 standard drinks of alcohol    Types: 6 Glasses of wine per week   Drug use: No   Sexual activity: Yes    Comment: quit smoking in early 20's, smoked ~ 1pk/ week  Other Topics Concern   Not on file  Social History Narrative   Not on file   Social Drivers of Health   Financial Resource Strain: Low Risk  (08/08/2022)   Overall Financial Resource Strain (CARDIA)    Difficulty of Paying Living Expenses: Not hard at all  Food Insecurity: No Food Insecurity (05/09/2023)   Hunger Vital Sign    Worried About Running Out of Food in the Last Year: Never true    Ran Out of Food in the Last Year: Never true  Transportation Needs: No Transportation Needs (05/09/2023)   PRAPARE - Administrator, Civil Service (Medical): No    Lack of Transportation (Non-Medical): No  Physical Activity: Sufficiently Active (08/08/2022)   Exercise Vital Sign    Days of Exercise per Week: 7 days    Minutes of Exercise per Session: 70 min  Stress: Not on file  Social Connections: Not on file     Family History: The patient's family history includes Bladder Cancer in his mother; Breast cancer in his mother; Hypertension in his father; Lung cancer in his mother. There is no history of Colon cancer, Pancreatic cancer, Stomach cancer, Esophageal cancer, Diabetes, Heart disease, Rectal cancer, or Colon polyps. ROS:   Please see the history of present illness.    All 14 point review of systems negative except as  described per history of present illness.  EKGs/Labs/Other Studies Reviewed:    The following studies were reviewed today:   EKG:  EKG Interpretation Date/Time:  Wednesday September 24 2023 14:45:07 EDT Ventricular Rate:  78 PR Interval:  174 QRS Duration:  104 QT Interval:  364 QTC  Calculation: 414 R Axis:   48  Text Interpretation: Normal sinus rhythm Incomplete right bundle branch block Anterior infarct , age undetermined Abnormal ECG When compared with ECG of 14-Jan-2011 08:57, No significant change was found Confirmed by Liborio Hai reddy [phone]) on 09/24/2023 2:59:31 PM    Recent Labs: 05/09/2023: ALT 29; BUN 25; Creatinine 1.28; Hemoglobin 15.3; Platelet Count 233; Potassium 4.3; Sodium 140  Recent Lipid Panel No results found for: CHOL, TRIG, HDL, CHOLHDL, VLDL, LDLCALC, LDLDIRECT  Physical Exam:    VS:  BP 110/70   Pulse 78   Ht 5' 5.6 (1.666 m)   Wt 171 lb 3.2 oz (77.7 kg)   SpO2 98%   BMI 27.97 kg/m     Wt Readings from Last 3 Encounters:  09/24/23 171 lb 3.2 oz (77.7 kg)  05/09/23 182 lb 6.4 oz (82.7 kg)  08/08/22 178 lb (80.7 kg)     GENERAL:  Well nourished, well developed in no acute distress NECK: No JVD; No carotid bruits CARDIAC: RRR, S1 and S2 present, no murmurs, no rubs, no gallops CHEST:  Clear to auscultation without rales, wheezing or rhonchi  Extremities: No pitting pedal edema. Pulses bilaterally symmetric with radial 2+ and dorsalis pedis 2+ NEUROLOGIC:  Alert and oriented x 3  Medication Adjustments/Labs and Tests Ordered: Current medicines are reviewed at length with the patient today.  Concerns regarding medicines are outlined above.  Orders Placed This Encounter  Procedures   CT CORONARY MORPH W/CTA COR W/SCORE W/CA W/CM &/OR WO/CM   Comp Met (CMET)   CBC   Lipid Profile   EKG 12-Lead   ECHOCARDIOGRAM COMPLETE   Meds ordered this encounter  Medications   atorvastatin  (LIPITOR) 40 MG tablet    Sig: Please  take this medication 5 times per week.    Dispense:  90 tablet    Refill:  3   metoprolol  tartrate (LOPRESSOR ) 100 MG tablet    Sig: Take 1 tablet (100 mg total) by mouth once for 1 dose. Please take this medication 2 hours before CT.    Dispense:  1 tablet    Refill:  0    Signed, Siara Gorder reddy Shigeko Manard, MD, MPH, [facility]. 09/24/2023 4:00 PM    [facility]

## 2023-09-25 DIAGNOSIS — I2510 Atherosclerotic heart disease of native coronary artery without angina pectoris: Secondary | ICD-10-CM | POA: Diagnosis not present

## 2023-09-25 DIAGNOSIS — I10 Essential (primary) hypertension: Secondary | ICD-10-CM | POA: Diagnosis not present

## 2023-09-25 DIAGNOSIS — R072 Precordial pain: Secondary | ICD-10-CM | POA: Diagnosis not present

## 2023-09-25 DIAGNOSIS — R0609 Other forms of dyspnea: Secondary | ICD-10-CM | POA: Diagnosis not present

## 2023-09-25 DIAGNOSIS — E782 Mixed hyperlipidemia: Secondary | ICD-10-CM | POA: Diagnosis not present

## 2023-09-26 LAB — COMPREHENSIVE METABOLIC PANEL WITH GFR
ALT: 18 IU/L (ref 0–44)
AST: 17 IU/L (ref 0–40)
Albumin: 4.1 g/dL (ref 3.8–4.8)
Alkaline Phosphatase: 71 IU/L (ref 47–123)
BUN/Creatinine Ratio: 16 (ref 10–24)
BUN: 19 mg/dL (ref 8–27)
Bilirubin Total: 2.7 mg/dL — ABNORMAL HIGH (ref 0.0–1.2)
CO2: 22 mmol/L (ref 20–29)
Calcium: 9.5 mg/dL (ref 8.6–10.2)
Chloride: 103 mmol/L (ref 96–106)
Creatinine, Ser: 1.21 mg/dL (ref 0.76–1.27)
Globulin, Total: 1.7 g/dL (ref 1.5–4.5)
Glucose: 88 mg/dL (ref 70–99)
Potassium: 4.2 mmol/L (ref 3.5–5.2)
Sodium: 141 mmol/L (ref 134–144)
Total Protein: 5.8 g/dL — ABNORMAL LOW (ref 6.0–8.5)
eGFR: 64 mL/min/1.73 (ref >59)

## 2023-09-26 LAB — CBC
Hematocrit: 49.6 % (ref 37.5–51.0)
Hemoglobin: 16 g/dL (ref 13.0–17.7)
MCH: 30.4 pg (ref 26.6–33.0)
MCHC: 32.3 g/dL (ref 31.5–35.7)
MCV: 94 fL (ref 79–97)
Platelets: 226 x10E3/uL (ref 150–450)
RBC: 5.27 x10E6/uL (ref 4.14–5.80)
RDW: 12.7 % (ref 11.6–15.4)
WBC: 5 x10E3/uL (ref 3.4–10.8)

## 2023-09-26 LAB — LIPID PANEL
Chol/HDL Ratio: 3.1 ratio (ref 0.0–5.0)
Cholesterol, Total: 155 mg/dL (ref 100–199)
HDL: 50 mg/dL (ref >39)
LDL Chol Calc (NIH): 86 mg/dL (ref 0–99)
Triglycerides: 102 mg/dL (ref 0–149)
VLDL Cholesterol Cal: 19 mg/dL (ref 5–40)

## 2023-09-30 DIAGNOSIS — L821 Other seborrheic keratosis: Secondary | ICD-10-CM | POA: Diagnosis not present

## 2023-09-30 DIAGNOSIS — D224 Melanocytic nevi of scalp and neck: Secondary | ICD-10-CM | POA: Diagnosis not present

## 2023-09-30 DIAGNOSIS — D1801 Hemangioma of skin and subcutaneous tissue: Secondary | ICD-10-CM | POA: Diagnosis not present

## 2023-09-30 DIAGNOSIS — D2262 Melanocytic nevi of left upper limb, including shoulder: Secondary | ICD-10-CM | POA: Diagnosis not present

## 2023-09-30 DIAGNOSIS — D2372 Other benign neoplasm of skin of left lower limb, including hip: Secondary | ICD-10-CM | POA: Diagnosis not present

## 2023-09-30 DIAGNOSIS — D225 Melanocytic nevi of trunk: Secondary | ICD-10-CM | POA: Diagnosis not present

## 2023-09-30 DIAGNOSIS — D171 Benign lipomatous neoplasm of skin and subcutaneous tissue of trunk: Secondary | ICD-10-CM | POA: Diagnosis not present

## 2023-09-30 DIAGNOSIS — L820 Inflamed seborrheic keratosis: Secondary | ICD-10-CM | POA: Diagnosis not present

## 2023-10-08 ENCOUNTER — Telehealth: Payer: Self-pay

## 2023-10-08 NOTE — Telephone Encounter (Signed)
 Calling to say authorization needs to be submitted for patient CT. Stated to call this number [phone] [employer]. Please advise

## 2023-10-09 NOTE — Telephone Encounter (Signed)
 No worries :)

## 2023-10-13 ENCOUNTER — Encounter (HOSPITAL_COMMUNITY): Payer: Self-pay | Admitting: Gastroenterology

## 2023-10-13 ENCOUNTER — Telehealth: Payer: Self-pay

## 2023-10-13 ENCOUNTER — Telehealth: Payer: Self-pay | Admitting: Gastroenterology

## 2023-10-13 NOTE — Telephone Encounter (Signed)
 Called and spoke with pt. Discussed with pt the need to cancel his colonoscopy until after his work up with cardiology was complete per anesthesia recommendations. Pt voiced frustration, but verbalized understanding. Advised pt to reach back out to us  once he completed all of the testing and received results then we could reach out and request medical clearance and get him rescheduled. Appt for 10/13 cancelled.

## 2023-10-13 NOTE — Telephone Encounter (Signed)
 Received a notification from pre-admission nurse at [facility].   this pt is for next Mon 10/13 and last time he saw his cardiologist he was having some dyspnea so they ordered a TTE and CT angiogram. Due to the pending cardiac tests anesthesia is requesting they either get done before the procedure or have cardiology clearance stating he doesn't need before procedure.   Will route to Dr. Legrand to review and advise recommendations.

## 2023-10-13 NOTE — Telephone Encounter (Addendum)
 Procedure:Colonoscopy Procedure date: 10/20/23 Procedure location: [facility] Arrival Time: 6:00 am Spoke with the patient Y/N:   No, I left a detailed message on [phone] on 10/13/23 @ 9:35 am for the patient to return call Yes, 10/13/23 @ 1:36 pm    Any prep concerns? No  Has the patient obtained the prep from the pharmacy ? Yes Do you have a care partner and transportation: Yes Any additional concerns? Was wondering about his medication since it was red, pt was told that the color of the medications were fine but he would get a call from the hospital and they would go over any instructions they require. This call is just a reminder call and to make sure he has his prep.

## 2023-10-13 NOTE — Progress Notes (Signed)
 Attempted to obtain medical history for pre op call via telephone, unable to reach at this time. HIPAA compliant voicemail message left requesting return call to pre surgical testing department.

## 2023-10-13 NOTE — Progress Notes (Signed)
 Pre op call David Chen      PCP-Street MD Cardiologist-Medireddy MD Pulmonologist- n/a  EKG-09/24/23 Echo-n/a Cath-n/a Stress-n/a ICD/PM-n/a  GLP1-n/a Blood Thinner-n/a  History:OSA, HTN, Gastrointestinal Bleeding, Factor IX /Hemophiliac B, CAD. Patient saw his cardiologist 9/17 where he was having some DOE so they ordered CT angiogram to be done on 10/9, and a TTE 10/14. Per pt he was having some breathing issues/wheezing but in the last couple weeks but feeling better not having that anymore, [facility], [facility]. Anesthesia Review- Yes- needs to have pending cardiac studies before procedure or cardiac clearance stating doesn't need these before procedure. Notified GI office via epic chat 10/6.

## 2023-10-13 NOTE — Telephone Encounter (Signed)
 Spoke with patient regarding prep information. Verified that he does have prep, care partner and transportation. He is aware of his arrival time as well. PT stated that he has questions about BP medication because it is red. Please advise.

## 2023-10-13 NOTE — Telephone Encounter (Signed)
 Thank you for the note.  Given that information, this patient's colonoscopy for 10/20/2023 must be canceled because his cardiac workup is needed prior to undergoing anesthesia for this routine procedure.  Please asked the patient to contact us  after he has heard from his cardiologist regarding those test results, and then I can communicate with that provider to determine the appropriate timing of the colonoscopy.  David Chen needs to be contacted with this information.  VEAR Brand MD

## 2023-10-14 ENCOUNTER — Encounter (HOSPITAL_COMMUNITY): Payer: Self-pay

## 2023-10-16 ENCOUNTER — Ambulatory Visit (INDEPENDENT_AMBULATORY_CARE_PROVIDER_SITE_OTHER)
Admission: RE | Admit: 2023-10-16 | Discharge: 2023-10-16 | Disposition: A | Discharge status: Home / Self Care | Source: Ambulatory Visit

## 2023-10-16 DIAGNOSIS — R0609 Other forms of dyspnea: Secondary | ICD-10-CM | POA: Diagnosis not present

## 2023-10-16 DIAGNOSIS — I2510 Atherosclerotic heart disease of native coronary artery without angina pectoris: Secondary | ICD-10-CM | POA: Diagnosis not present

## 2023-10-16 MED ORDER — IOHEXOL 350 MG/ML SOLN
100.0000 mL | Freq: Once | INTRAVENOUS | Status: AC | PRN
  Administered 2023-10-16: 100 mL via INTRAVENOUS

## 2023-10-16 MED ORDER — NITROGLYCERIN 0.4 MG SL SUBL
0.8000 mg | SUBLINGUAL_TABLET | Freq: Once | SUBLINGUAL | Status: AC
  Administered 2023-10-16: 0.8 mg via SUBLINGUAL

## 2023-10-20 ENCOUNTER — Ambulatory Visit (HOSPITAL_COMMUNITY): Admission: RE | Admit: 2023-10-20 | Source: Home / Self Care | Admitting: Gastroenterology

## 2023-10-20 SURGERY — COLONOSCOPY
Anesthesia: Monitor Anesthesia Care

## 2023-10-21 ENCOUNTER — Ambulatory Visit

## 2023-10-21 DIAGNOSIS — R0609 Other forms of dyspnea: Secondary | ICD-10-CM | POA: Diagnosis not present

## 2023-10-22 LAB — ECHOCARDIOGRAM COMPLETE
AR max vel: 1.63 cm2
AV Area VTI: 1.65 cm2
AV Area mean vel: 1.61 cm2
AV Mean grad: 4 mmHg
AV Peak grad: 7.7 mmHg
Ao pk vel: 1.39 m/s
Area-P 1/2: 3.13 cm2
MV M vel: 1.57 m/s
MV Peak grad: 9.9 mmHg
MV VTI: 1.06 cm2
S' Lateral: 3 cm

## 2023-10-31 NOTE — Telephone Encounter (Signed)
 Patient calling to schedule hospital procedure. Please advise.

## 2023-11-01 NOTE — Telephone Encounter (Signed)
  Brandi,,  Thank you for the note. In fact I do need to communicate with this man's cardiologist regarding the results of his testing and what ever treatment and follow-up he needs prior to undergoing his colonoscopy. The [facility] long anesthesia service will require this.  -HD  __________________ Dr. Madiredddy,  This mutual patient last saw you on 09/24/2023 with a history of CAD describing worsening dyspnea on exertion.  He was scheduled for a routine colon polyp surveillance colonoscopy on 10/20/2023, but was canceled by anesthesia services the week prior after they reviewed your note and because he had not completed his cardiac testing by then.  He had his coronary CT on 10/16/2023 and his echocardiogram on 10/21/2023.  As near as I can tell, he is next scheduled for an office appointment with you in December.  I would greatly appreciate hearing from you regarding his follow-up plans at your office, and would also appreciate receiving a note from that visit so we can plan accordingly. I see that he has significant coronary findings on the scan and I am wondering about further testing or treatment in order to have preprocedure cardiology evaluation.  Again, it is a routine surveillance colonoscopy, so we will wait to hear from you before making plans to reschedule his procedure.  Thanks very much  Victory Brand, Adult Nurse GI

## 2023-11-03 NOTE — Telephone Encounter (Signed)
 Spoke with pt. Updated him that Dr. Legrand has reach out to his cardiology provider to determine the best timing to get the pt scheduled for his colonoscopy. Pt verbalized understanding.

## 2023-11-04 ENCOUNTER — Ambulatory Visit: Payer: Self-pay

## 2023-11-05 ENCOUNTER — Other Ambulatory Visit: Payer: Self-pay

## 2023-11-05 DIAGNOSIS — Z860100 Personal history of colon polyps, unspecified: Secondary | ICD-10-CM

## 2023-11-05 NOTE — Telephone Encounter (Signed)
 Pt is scheduled for 12/15/2023 procedure at [facility]. Arrival time of 0900 and procedure start time of 1030. Instructions typed and sent to pt via my chart and mail.   Pt notified of arrival time. Verbalized understanding.

## 2023-11-05 NOTE — Telephone Encounter (Signed)
 Daphne,    Patient's cardiologist replied that we are okay to proceed with his colonoscopy.  My next available hospital outpatient slot is on 12/15/2023.  Please call Mr. Manigo and offer him that slot.  If he cannot do that, then we need to wait for the January schedule to come out.  HD

## 2023-11-05 NOTE — Telephone Encounter (Signed)
 Called and spoke with pt. He is agreeable to have his procedure on 12/15/23. Pt already has prep at home from previous cancelled colonoscopy.

## 2023-12-08 ENCOUNTER — Telehealth: Payer: Self-pay | Admitting: Gastroenterology

## 2023-12-08 NOTE — Telephone Encounter (Addendum)
 Procedure:Colonoscopy Procedure date: 12/15/23 Procedure location: [facility] Arrival Time: 9:00 am Spoke with the patient Y/N:   No, I left a detailed message on [phone] on 12/08/23 @ 10:16 am for the patient to return call  Yes, 12/09/23 @ 1:22 pm   Any prep concerns? No  Has the patient obtained the prep from the pharmacy ? Yes Do you have a care partner and transportation: Yes Any additional concerns? No

## 2023-12-15 ENCOUNTER — Ambulatory Visit (HOSPITAL_COMMUNITY): Admitting: Certified Registered"

## 2023-12-15 ENCOUNTER — Ambulatory Visit (HOSPITAL_COMMUNITY)
Admission: RE | Admit: 2023-12-15 | Discharge: 2023-12-15 | Disposition: A | Attending: Gastroenterology | Admitting: Gastroenterology

## 2023-12-15 ENCOUNTER — Other Ambulatory Visit: Payer: Self-pay

## 2023-12-15 ENCOUNTER — Encounter (HOSPITAL_COMMUNITY): Payer: Self-pay | Admitting: Gastroenterology

## 2023-12-15 ENCOUNTER — Encounter (HOSPITAL_COMMUNITY): Admission: RE | Disposition: A | Payer: Self-pay | Source: Home / Self Care | Attending: Gastroenterology

## 2023-12-15 DIAGNOSIS — Q438 Other specified congenital malformations of intestine: Secondary | ICD-10-CM | POA: Diagnosis not present

## 2023-12-15 DIAGNOSIS — D123 Benign neoplasm of transverse colon: Secondary | ICD-10-CM

## 2023-12-15 DIAGNOSIS — Z87891 Personal history of nicotine dependence: Secondary | ICD-10-CM | POA: Diagnosis not present

## 2023-12-15 DIAGNOSIS — K648 Other hemorrhoids: Secondary | ICD-10-CM | POA: Diagnosis not present

## 2023-12-15 DIAGNOSIS — Z860101 Personal history of adenomatous and serrated colon polyps: Secondary | ICD-10-CM | POA: Diagnosis not present

## 2023-12-15 DIAGNOSIS — D122 Benign neoplasm of ascending colon: Secondary | ICD-10-CM | POA: Diagnosis not present

## 2023-12-15 DIAGNOSIS — K589 Irritable bowel syndrome without diarrhea: Secondary | ICD-10-CM | POA: Diagnosis not present

## 2023-12-15 DIAGNOSIS — Z1211 Encounter for screening for malignant neoplasm of colon: Secondary | ICD-10-CM | POA: Diagnosis not present

## 2023-12-15 DIAGNOSIS — I10 Essential (primary) hypertension: Secondary | ICD-10-CM | POA: Diagnosis not present

## 2023-12-15 DIAGNOSIS — K635 Polyp of colon: Secondary | ICD-10-CM | POA: Diagnosis not present

## 2023-12-15 DIAGNOSIS — I2510 Atherosclerotic heart disease of native coronary artery without angina pectoris: Secondary | ICD-10-CM | POA: Diagnosis not present

## 2023-12-15 DIAGNOSIS — Z860100 Personal history of colon polyps, unspecified: Secondary | ICD-10-CM | POA: Diagnosis not present

## 2023-12-15 HISTORY — PX: COLONOSCOPY: SHX5424

## 2023-12-15 SURGERY — COLONOSCOPY
Anesthesia: Monitor Anesthesia Care

## 2023-12-15 MED ORDER — PROPOFOL 10 MG/ML IV BOLUS
INTRAVENOUS | Status: DC | PRN
  Administered 2023-12-15: 20 mg via INTRAVENOUS

## 2023-12-15 MED ORDER — PROPOFOL 1000 MG/100ML IV EMUL
INTRAVENOUS | Status: AC
  Filled 2023-12-15: qty 100

## 2023-12-15 MED ORDER — SPOT INK MARKER SYRINGE KIT
PACK | SUBMUCOSAL | Status: DC | PRN
  Administered 2023-12-15: 1.5 mL via SUBMUCOSAL

## 2023-12-15 MED ORDER — PHENYLEPHRINE 80 MCG/ML (10ML) SYRINGE FOR IV PUSH (FOR BLOOD PRESSURE SUPPORT)
PREFILLED_SYRINGE | INTRAVENOUS | Status: DC | PRN
  Administered 2023-12-15 (×3): 80 ug via INTRAVENOUS

## 2023-12-15 MED ORDER — SODIUM CHLORIDE 0.9 % IV SOLN
INTRAVENOUS | Status: AC | PRN
  Administered 2023-12-15: 500 mL via INTRAMUSCULAR

## 2023-12-15 MED ORDER — PROPOFOL 500 MG/50ML IV EMUL
INTRAVENOUS | Status: DC | PRN
  Administered 2023-12-15: 135 ug/kg/min via INTRAVENOUS

## 2023-12-15 NOTE — Op Note (Signed)
 [facility] Patient Name: David Chen Procedure Date: 12/15/2023 MRN: [id number] Attending MD: Victory CROME. Legrand , MD, [id number] Date of Birth: May 12, 1950 CSN: [id number] Age: 73 Admit Type: Outpatient Procedure:                Colonoscopy Indications:              Personal history of colonic polyps                           adenomas in 2015                           8mm SSP w/o dysplasia removed hot snare Sept 2020 -                            post-polypectomy bleed (Hemophilia B) Providers:                Victory CROME. Legrand, MD, Jacquelyn Jaci Pierce, RN,                            Curtistine Bishop, Technician Referring MD:              Medicines:                Monitored Anesthesia Care Complications:            No immediate complications. Estimated Blood Loss:     Estimated blood loss: none. Procedure:                Pre-Anesthesia Assessment:                           - Prior to the procedure, a History and Physical                            was performed, and patient medications and                            allergies were reviewed. The patient's tolerance of                            previous anesthesia was also reviewed. The risks                            and benefits of the procedure and the sedation                            options and risks were discussed with the patient.                            All questions were answered, and informed consent                            was obtained. Prior Anticoagulants: The patient has                            taken no anticoagulant  or antiplatelet agents. ASA                            Grade Assessment: III - A patient with severe                            systemic disease. After reviewing the risks and                            benefits, the patient was deemed in satisfactory                            condition to undergo the procedure.                           After obtaining informed consent, the  colonoscope                            was passed under direct vision. Throughout the                            procedure, the patient's blood pressure, pulse, and                            oxygen saturations were monitored continuously. The                            CF-HQ190L ([id number]) Olympus colonoscope was                            introduced through the anus and advanced to the the                            cecum, identified by appendiceal orifice and                            ileocecal valve. The colonoscopy was somewhat                            difficult due to a redundant and spastic colon.                            Successful completion of the procedure was aided by                            using manual pressure and straightening and                            shortening the scope to obtain bowel loop                            reduction. The patient tolerated the procedure  well. The quality of the bowel preparation was                            good. The ileocecal valve, appendiceal orifice, and                            rectum were photographed. Scope In: 9:56:14 AM Scope Out: 10:45:27 AM Scope Withdrawal Time: 0 hours 45 minutes 2 seconds  Total Procedure Duration: 0 hours 49 minutes 13 seconds  Findings:      The perianal and digital rectal examinations were normal.      Repeat examination of right colon under NBI performed.      A 10 mm polyp was found in the proximal ascending colon. The polyp was       sessile and draped over a fold, located several mucosal folds distal to       the ICV. The polyp was removed with a hot snare. Resection and retrieval       were complete. To prevent bleeding post-intervention, three hemostatic       clips were successfully placed (MR conditional). Jar 1      A diminutive polyp was found in the mid ascending colon. The polyp was       semi-sessile. The polyp was removed with a hot snare. Resection and        retrieval were complete. To prevent bleeding post-intervention, one       hemostatic clip was successfully placed (MR conditional). Jar 1      A 15 mm polyp was found in approximately the proximal transverse colon.       The polyp was multi-lobulated, sessile and semi-sessile. It was also       located in a challenging position due to anatomy and scope looping. The       polyp was removed with a piecemeal technique using a hot snare.       Resection and retrieval were complete. To prevent bleeding       post-intervention, four hemostatic clips were successfully placed (MR       conditional). Area just distal to the site was tattooed with an       injection of 1 mL of Spot (carbon black). Jar 2      Internal hemorrhoids were found.      The exam was otherwise without abnormality on direct and retroflexion       views. Impression:               - One 10 mm polyp in the proximal ascending colon,                            removed with a hot snare. Resected and retrieved.                            Clips (MR conditional) were placed.                           - One diminutive polyp in the mid ascending colon,                            removed with a hot snare. Resected and retrieved.  Clip (MR conditional) was placed.                           - One 15 mm polyp in the proximal transverse colon,                            removed piecemeal using a hot snare. Resected and                            retrieved. Clips (MR conditional) were placed. Site                            tattooed                           - Internal hemorrhoids.                           - The examination was otherwise normal on direct                            and retroflexion views. Moderate Sedation:      MAC sedation used Recommendation:           - Patient has a contact number available for                            emergencies. The signs and symptoms of potential                             delayed complications were discussed with the                            patient. Return to normal activities tomorrow.                            Written discharge instructions were provided to the                            patient.                           - Resume previous diet.                           - Continue present medications.                           - Await pathology results.                           - Repeat colonoscopy is recommended for                            surveillance. The colonoscopy date will be  determined after pathology results from today's                            exam become available for review. Procedure Code(s):        --- Professional ---                           [phone], Colonoscopy, flexible; with removal of                            tumor(s), polyp(s), or other lesion(s) by snare                            technique                           45381, Colonoscopy, flexible; with directed                            submucosal injection(s), any substance Diagnosis Code(s):        --- Professional ---                           Z86.010, Personal history of colonic polyps                           D12.2, Benign neoplasm of ascending colon                           D12.3, Benign neoplasm of transverse colon (hepatic                            flexure or splenic flexure)                           K64.8, Other hemorrhoids CPT copyright 2022 American Medical Association. All rights reserved. The codes documented in this report are preliminary and upon coder review may  be revised to meet current compliance requirements. [place] Mallon L. Legrand, MD 12/15/2023 10:58:17 AM This report has been signed electronically. Number of Addenda: 0

## 2023-12-15 NOTE — Anesthesia Procedure Notes (Signed)
 Procedure Name: MAC Date/Time: 12/15/2023 9:51 AM  Performed by: Brandy Almarie BROCKS, CRNAPre-anesthesia Checklist: Patient identified, Emergency Drugs available, Suction available and Patient being monitored Oxygen Delivery Method: Simple face mask

## 2023-12-15 NOTE — Discharge Instructions (Signed)

## 2023-12-15 NOTE — H&P (Signed)
 History and Physical:  This patient presents for endoscopic testing for:   History of colon polyps.  This patient is here for surveillance colonoscopy, having had an 8 mm SSP without dysplasia removed by hot snare polypectomy in September 2020. The patient had a post polypectomy bleed complicated by his hemophilia B. He also had adenomatous polyps in 2015  Patient denies chronic abdominal pain, rectal bleeding, constipation or diarrhea.  Patient is otherwise without complaints or active issues today.   Past Medical History: Past Medical History:  Diagnosis Date   Arthritis    lower back but not diagnosed per pt   Benign essential hypertension    BMI 27.0-27.9,adult    Clotting disorder    FACTOR IX DEFICIENCY   Diverticulosis    Epigastric abdominal pain 11/21/2010   Essential hypertension 01/27/2008   Qualifier: Diagnosis of   By: Drucilla BANANA, Pam         Factor IX deficiency (HCC)    Family history of lung cancer    GIB (gastrointestinal bleeding) 09/27/2018   HEMORRHOIDS, INTERNAL 09/14/2004   Qualifier: Diagnosis of   By: Drucilla BANANA, Pam      Replacing diagnoses that were inactivated after the 04/08/22 regulatory import     HEMORRHOIDS-EXTERNAL 01/27/2008   Qualifier: Diagnosis of   By: Kerman NP, Vina      Replacing diagnoses that were inactivated after the 04/08/22 regulatory import     High cholesterol    History of colonic polyps 07/12/2013   Adenomatous polyp 2006 and 2010  IMO SNOMED Dx Update Oct 2024     Hx of adenomatous colonic polyps    Hypertension    does not see a cardiologist   Inguinal hernia    on the right side- not causing issues , no pain per pt 09-11-2018   RECTAL BLEEDING 01/27/2008   Qualifier: Diagnosis of   By: Kerman NP, Paula         Sleep apnea    Sleep study done at [facility] sleep [facility] in [facility]- no cpap      Past Surgical History: Past Surgical History:  Procedure Laterality Date   CHOLECYSTECTOMY  01/18/2011    Procedure: LAPAROSCOPIC CHOLECYSTECTOMY WITH INTRAOPERATIVE CHOLANGIOGRAM;  Surgeon: Krystal JINNY Russell, MD;  Location: [facility] OR;  Service: General;  Laterality: N/A;   COLONOSCOPY  2006, 2-3 years ago   had issues with bleeding   POLYPECTOMY     TONSILLECTOMY     VASECTOMY      Allergies: Allergies  Allergen Reactions   Penicillins Hives    Did it involve swelling of the face/tongue/throat, SOB, or low BP? Yes Did it involve sudden or severe rash/hives, skin peeling, or any reaction on the inside of your mouth or nose? No Did you need to seek medical attention at a hospital or doctor's office? No When did it last happen?      recent If all above answers are "NO", may proceed with cephalosporin use.     Outpatient Meds: Current Facility-Administered Medications  Medication Dose Route Frequency Provider Last Rate Last Admin   0.9 %  sodium chloride  infusion    Continuous PRN Legrand Victory CROME III, MD 10 mL/hr at 12/15/23 0900 500 mL at 12/15/23 0900      ___________________________________________________________________ Objective   Exam:  BP 128/65   Pulse 67   Temp 97.8 F (36.6 C) (Temporal)   Resp 11   Ht 5' 6 (1.676 m)   Wt 81.6 kg  SpO2 100%   BMI 29.05 kg/m   CV: regular , S1/S2 Resp: clear to auscultation bilaterally, normal RR and effort noted GI: soft, no tenderness, with active bowel sounds.   Assessment: History of colon polyps   Plan: Colonoscopy   The benefits and risks of the planned procedure(s) were described in detail with the patient or (when appropriate) their health care proxy.  Risks were outlined as including, but not limited to, bleeding, infection, perforation, adverse medication reaction leading to cardiac or pulmonary decompensation, pancreatitis (if ERCP).  The limitation of incomplete mucosal visualization was also discussed.  No guarantees or warranties were given.  The patient was provided an opportunity to ask questions and all were  answered. The patient agreed with the plan.   The patient is appropriate for an endoscopic procedure in the ambulatory setting.   - Victory Brand, MD

## 2023-12-15 NOTE — Interval H&P Note (Signed)
 History and Physical Interval Note:  12/15/2023 9:47 AM  David Chen  has presented today for surgery, with the diagnosis of History of colon polyps.  The various methods of treatment have been discussed with the patient and family. After consideration of risks, benefits and other options for treatment, the patient has consented to  Procedure(s): COLONOSCOPY (N/A) as a surgical intervention.  The patient's history has been reviewed, patient examined, no change in status, stable for surgery.  I have reviewed the patient's chart and labs.  Questions were answered to the patient's satisfaction.     Victory LITTIE Brand III

## 2023-12-15 NOTE — Anesthesia Preprocedure Evaluation (Addendum)
 Anesthesia Evaluation  Patient identified by MRN, date of birth, ID band Patient awake    Reviewed: Allergy & Precautions, NPO status , Patient's Chart, lab work & pertinent test results  Airway Mallampati: II  TM Distance: >3 FB Neck ROM: Full    Dental  (+) Dental Advisory Given, Missing   Pulmonary sleep apnea , former smoker   Pulmonary exam normal breath sounds clear to auscultation       Cardiovascular hypertension, Pt. on medications + CAD  Normal cardiovascular exam Rhythm:Regular Rate:Normal     Neuro/Psych negative neurological ROS     GI/Hepatic Neg liver ROS,,,History of colon polyps   Endo/Other  negative endocrine ROS    Renal/GU negative Renal ROS     Musculoskeletal  (+) Arthritis ,    Abdominal   Peds  Hematology Factor IX deficiency    Anesthesia Other Findings Day of surgery medications reviewed with the patient.  Reproductive/Obstetrics                              Anesthesia Physical Anesthesia Plan  ASA: 3  Anesthesia Plan: MAC   Post-op Pain Management: Minimal or no pain anticipated   Induction: Intravenous  PONV Risk Score and Plan: 1 and TIVA and Treatment may vary due to age or medical condition  Airway Management Planned: Natural Airway and Simple Face Mask  Additional Equipment:   Intra-op Plan:   Post-operative Plan:   Informed Consent: I have reviewed the patients History and Physical, chart, labs and discussed the procedure including the risks, benefits and alternatives for the proposed anesthesia with the patient or authorized representative who has indicated his/her understanding and acceptance.     Dental advisory given  Plan Discussed with: CRNA and Anesthesiologist  Anesthesia Plan Comments:          Anesthesia Quick Evaluation

## 2023-12-15 NOTE — Transfer of Care (Signed)
 Immediate Anesthesia Transfer of Care Note  Patient: David Chen  Procedure(s) Performed: COLONOSCOPY  Patient Location: PACU  Anesthesia Type:MAC  Level of Consciousness: sedated  Airway & Oxygen Therapy: Patient Spontanous Breathing and Patient connected to face mask oxygen  Post-op Assessment: Report given to RN and Post -op Vital signs reviewed and stable  Post vital signs: Reviewed and stable  Last Vitals:  Vitals Value Taken Time  BP    Temp    Pulse    Resp    SpO2      Last Pain:  Vitals:   12/15/23 0843  TempSrc: Temporal  PainSc: 0-No pain         Complications: No notable events documented.

## 2023-12-16 ENCOUNTER — Ambulatory Visit: Payer: Self-pay | Admitting: Gastroenterology

## 2023-12-16 ENCOUNTER — Encounter (HOSPITAL_COMMUNITY): Payer: Self-pay | Admitting: Gastroenterology

## 2023-12-16 LAB — SURGICAL PATHOLOGY

## 2023-12-16 NOTE — Telephone Encounter (Signed)
 Letter mailed. Recall placed.

## 2023-12-16 NOTE — Telephone Encounter (Signed)
 Letter mailed. recall placed.

## 2023-12-16 NOTE — Anesthesia Postprocedure Evaluation (Signed)
 Anesthesia Post Note  Patient: David Chen  Procedure(s) Performed: COLONOSCOPY     Patient location during evaluation: PACU Anesthesia Type: MAC Level of consciousness: awake and alert Pain management: pain level controlled Vital Signs Assessment: post-procedure vital signs reviewed and stable Respiratory status: spontaneous breathing, nonlabored ventilation, respiratory function stable and patient connected to nasal cannula oxygen Cardiovascular status: stable and blood pressure returned to baseline Postop Assessment: no apparent nausea or vomiting Anesthetic complications: no   No notable events documented.  Last Vitals:  Vitals:   12/15/23 1110 12/15/23 1121  BP: 125/75 124/72  Pulse: (!) 58 (!) 55  Resp: 14 12  Temp:    SpO2: 99% 100%    Last Pain:  Vitals:   12/15/23 1121  TempSrc:   PainSc: 0-No pain                 Garnette FORBES Skillern

## 2023-12-18 ENCOUNTER — Ambulatory Visit: Payer: Self-pay | Admitting: Gastroenterology

## 2023-12-18 ENCOUNTER — Other Ambulatory Visit (INDEPENDENT_AMBULATORY_CARE_PROVIDER_SITE_OTHER)

## 2023-12-18 ENCOUNTER — Telehealth: Payer: Self-pay | Admitting: Gastroenterology

## 2023-12-18 DIAGNOSIS — K625 Hemorrhage of anus and rectum: Secondary | ICD-10-CM

## 2023-12-18 LAB — CBC WITH DIFFERENTIAL/PLATELET
Basophils Absolute: 0.1 K/uL (ref 0.0–0.1)
Basophils Relative: 0.9 % (ref 0.0–3.0)
Eosinophils Absolute: 0.2 K/uL (ref 0.0–0.7)
Eosinophils Relative: 3.6 % (ref 0.0–5.0)
HCT: 39.4 % (ref 39.0–52.0)
Hemoglobin: 13.7 g/dL (ref 13.0–17.0)
Lymphocytes Relative: 26.6 % (ref 12.0–46.0)
Lymphs Abs: 1.5 K/uL (ref 0.7–4.0)
MCHC: 34.8 g/dL (ref 30.0–36.0)
MCV: 89.4 fl (ref 78.0–100.0)
Monocytes Absolute: 0.6 K/uL (ref 0.1–1.0)
Monocytes Relative: 10.8 % (ref 3.0–12.0)
Neutro Abs: 3.2 K/uL (ref 1.4–7.7)
Neutrophils Relative %: 58.1 % (ref 43.0–77.0)
Platelets: 215 K/uL (ref 150.0–400.0)
RBC: 4.4 Mil/uL (ref 4.22–5.81)
RDW: 12.8 % (ref 11.5–15.5)
WBC: 5.5 K/uL (ref 4.0–10.5)

## 2023-12-18 NOTE — Telephone Encounter (Signed)
 Patient advised and agrees to this plan of action. He will come now and this will take him about 45 minutes to an hour to arrive. Order placed as STAT.

## 2023-12-18 NOTE — Telephone Encounter (Signed)
 Colonoscopy 3 days ago with polypectomies and clips placed on all of the sites (He has hemophilia B and had a post polypectomy bleed from a small hide polypectomy site in 2020)  Please have him come to the lab for stat CBC this afternoon then check up on the results of that while I am in clinic so we can give him further recommendations.  I am also on-call tonight if needed  (I am off tomorrow-contact DOD if this issue is ongoing, pending CBC results later today)  - HD

## 2023-12-18 NOTE — Telephone Encounter (Signed)
 His hemoglobin is reassuringly in the normal range at 13.7.  However, this is a decrease from his usual hemoglobin level of 15-16  I remain concerned because of what he is describing and previous history of post polypectomy bleed.  My advice is that he come to our lab first thing tomorrow morning for a repeat stat CBC and then wait here for results and keep his phone with him.  I will be off tomorrow, so please check first thing with the AM DOD Zane) for further advice based on the results and whether or not patient is still reporting passing blood.  I will be on-call this evening if David Chen needs to contact me.  If the bleeding he is describing is escalating and certainly if he has weakness dizziness chest pain or shortness of breath, he should proceed immediately to the nearest ED where he could be evaluated and transferred to our facility if needed.  (He lives in [place])  [place]

## 2023-12-18 NOTE — Telephone Encounter (Signed)
 Colonoscopy on 12/15/23. No blood that day. He ate a hand full of peanuts. Tuesday, no blood. Wednesday and today he notes dark stool and when he flushes the toilet he can tell it's blood. Denies cramping. No abdominal pain. Otherwise I feel great.

## 2023-12-18 NOTE — Telephone Encounter (Signed)
 Inbound call from patient stating that the is still passing  blood in his stool after having his colonoscopy on 12/8 and is requesting a call back to discuss. Please advise.

## 2023-12-18 NOTE — Telephone Encounter (Signed)
 Message and instructions given to the patient.  Agrees to the plan of care.

## 2023-12-18 NOTE — Addendum Note (Signed)
 Addended by: CONCHA NORRIS A on: 12/18/2023 04:07 PM   Modules accepted: Orders

## 2023-12-19 ENCOUNTER — Other Ambulatory Visit (INDEPENDENT_AMBULATORY_CARE_PROVIDER_SITE_OTHER)

## 2023-12-19 DIAGNOSIS — K625 Hemorrhage of anus and rectum: Secondary | ICD-10-CM | POA: Diagnosis not present

## 2023-12-19 LAB — CBC WITH DIFFERENTIAL/PLATELET
Basophils Absolute: 0 K/uL (ref 0.0–0.1)
Basophils Relative: 0.9 % (ref 0.0–3.0)
Eosinophils Absolute: 0.2 K/uL (ref 0.0–0.7)
Eosinophils Relative: 3.8 % (ref 0.0–5.0)
HCT: 39.7 % (ref 39.0–52.0)
Hemoglobin: 13.8 g/dL (ref 13.0–17.0)
Lymphocytes Relative: 24.8 % (ref 12.0–46.0)
Lymphs Abs: 1.4 K/uL (ref 0.7–4.0)
MCHC: 34.6 g/dL (ref 30.0–36.0)
MCV: 89.1 fl (ref 78.0–100.0)
Monocytes Absolute: 0.5 K/uL (ref 0.1–1.0)
Monocytes Relative: 9.6 % (ref 3.0–12.0)
Neutro Abs: 3.5 K/uL (ref 1.4–7.7)
Neutrophils Relative %: 60.9 % (ref 43.0–77.0)
Platelets: 224 K/uL (ref 150.0–400.0)
RBC: 4.46 Mil/uL (ref 4.22–5.81)
RDW: 12.7 % (ref 11.5–15.5)
WBC: 5.7 K/uL (ref 4.0–10.5)

## 2023-12-19 NOTE — Telephone Encounter (Signed)
 DOD Dr Charlanne Please see previous note from Dr Legrand. Patient has had his CBC done today. Hgb is 13.8 today. He reports 2 bowel movements this morning. The last bowel movement appeared normal to the patient without any noted blood or maroon colored stool.

## 2023-12-19 NOTE — Telephone Encounter (Signed)
 Hb stable Last bowel movement without any blood Postpolypectomy bleed appears to have resolved. No need for any further intervention unless any further bleeding RG

## 2023-12-20 ENCOUNTER — Telehealth: Payer: Self-pay | Admitting: Nurse Practitioner

## 2023-12-20 NOTE — Telephone Encounter (Signed)
°  Patient had a colonoscopy several days ago.  He has a history of hemophilia B and a history of post polypectomy bleeding.   Several days ago patient had a colonoscopy with removal of two  polyps via hot snare.  He has been having painless lower GI bleeding over last few days for which he has been in contact with our office. Got CBC at our office yesterday. Hgb normal in 13 range but down from baseline 15-16.   Since having labs at our office yesterday afternoon patient had not had any further bleeding until this morning when he had a dark stool with red blood .  He has not had any further bleeding since but has started feeling lightheaded today.  Dr. Leigh and I spoke with the patient and recommended that he monitor for ongoing bleeding.  If he has more episodes of bleeding today and/or has progressive lightheadedness then his wife will take him to the closest ED for evaluation.    Colonoscopy  - One 10 mm polyp in the proximal ascending colon, removed with a hot snare. Resected and retrieved. Clips (MR conditional) were placed. - One diminutive polyp in the mid ascending colon, removed with a hot snare. Resected and retrieved. Clip (MR conditional) was placed. - One 15 mm polyp in the proximal transverse colon, removed piecemeal using a hot snare. Resected and retrieved. Clips (MR conditional) were placed. Site tattooed - Internal hemorrhoids. - The examination was otherwise normal on direct and retroflexion views

## 2023-12-22 NOTE — Telephone Encounter (Signed)
 Called the patient. No answer. Left a message for the patient. Asked he call me if he is having any maroon or black colored stools or any other red flag symptoms.

## 2023-12-22 NOTE — Telephone Encounter (Signed)
 Please review previous notes. Do you think the patient needs any further CBC to check the hemoglobin?

## 2023-12-22 NOTE — Telephone Encounter (Signed)
 PT is returning call to find out if he needs to come in for further lab testing. Please advise.

## 2023-12-23 NOTE — Telephone Encounter (Signed)
 Thanks for attending to this while I was out of the office.  CBC stable on second check late last week, suggesting bleeding had stopped.  We certainly need to hear from him if he has recurrent bleeding.  H Danis

## 2023-12-24 ENCOUNTER — Telehealth: Payer: Self-pay

## 2023-12-24 ENCOUNTER — Ambulatory Visit

## 2023-12-24 VITALS — BP 128/58 | HR 76 | Ht 66.0 in | Wt 176.5 lb

## 2023-12-24 DIAGNOSIS — I340 Nonrheumatic mitral (valve) insufficiency: Secondary | ICD-10-CM | POA: Diagnosis not present

## 2023-12-24 DIAGNOSIS — I2510 Atherosclerotic heart disease of native coronary artery without angina pectoris: Secondary | ICD-10-CM

## 2023-12-24 DIAGNOSIS — E782 Mixed hyperlipidemia: Secondary | ICD-10-CM

## 2023-12-24 DIAGNOSIS — I10 Essential (primary) hypertension: Secondary | ICD-10-CM | POA: Diagnosis not present

## 2023-12-24 MED ORDER — ATORVASTATIN CALCIUM 40 MG PO TABS
ORAL_TABLET | ORAL | 3 refills | Status: AC
  Filled 2024-02-10: qty 80, 100d supply, fill #0

## 2023-12-24 MED ORDER — ASPIRIN 81 MG PO TBEC: 81.0000 mg | DELAYED_RELEASE_TABLET | Freq: Every day | ORAL | 3 refills | Status: AC

## 2023-12-24 MED ORDER — ASPIRIN 81 MG PO TBEC: 81.0000 mg | DELAYED_RELEASE_TABLET | Freq: Every day | ORAL | Status: DC

## 2023-12-24 NOTE — Assessment & Plan Note (Signed)
 Lipid panel from 09/25/2023 shows HDL 50, LDL 86, triglycerides 102 and total cholesterol 155.  Continue atorvastatin , titrate up the dose to 80 mg once daily in the setting of severe coronary atherosclerosis based on calcium  score.  Will reassess lipid panel in 3 months along with liver function test.  If LDL not at goal less than 70 mg/dL will consider adding Zetia and/or PCSK9 inhibitors.

## 2023-12-24 NOTE — Assessment & Plan Note (Signed)
 Seen for further evaluation of coronary atherosclerosis noted on CT chest and symptoms of shortness of breath with moderate exertion. Cardiac CT from 10/16/2023 shows calcium  score 738, mild nonobstructive disease except for proximal portion of small diagonal 2 branch appearing to be severely stenosed.  Extracardiac findings showed no significant abnormalities. Echocardiogram from 10/21/2023 notes normal biventricular function with LVEF 50 to 55%, no wall motion abnormality, GLS -20.1%, mild MR.  His symptoms have improved. Denies any anginal symptoms at this time.  Reviewed the results extensively. Given small branch disease, continue with aggressive medical therapy. Pursue cardiac cath if any worsening of symptoms such as chest pain or shortness of breath with exertion or reduced effort tolerance.  Would recommend starting aspirin  81 mg once daily. Will send out a message to his hematologist Dr. Ezzard with regards to safety taking aspirin  given his history of  factor IX deficiency.  Titrate up lipid-lowering therapy as discussed under hyperlipidemia.

## 2023-12-24 NOTE — Assessment & Plan Note (Signed)
 Mild MR not expected to cause any symptoms.  Will follow-up with repeat echocardiogram tentatively in 2 years.

## 2023-12-24 NOTE — Progress Notes (Signed)
 Cardiology Consultation:    Date:  12/24/2023   ID:  David Chen, DOB 15-Aug-1950, MRN [id number]  PCP:  David Chen, David HERO, MD  Cardiologist:  David SAUNDERS Jennette Leask, MD   Referring MD: David Chen, David HERO, MD   No chief complaint on file.    ASSESSMENT AND PLAN:   Mr David Chen 73 year old male history of CAD, former smoker, 2 or 3 beers per week alcohol consumption, hypertension, hyperlipidemia, takes Lipitor twice weekly due to concerns about bleeding disorder factor IX deficiency [previously followed up with Dr. Morrell, GERD, obesity, depression, factor IX deficiency, thyroid  nodule OSA-using CPAP.  Seen for further evaluation of coronary atherosclerosis noted on CT chest and symptoms of shortness of breath with moderate exertion. Cardiac CT from 10/16/2023 shows calcium  score 738, mild nonobstructive disease except for proximal portion of small diagonal 2 branch appearing to be severely stenosed.  Extracardiac findings showed no significant abnormalities. Echocardiogram from 10/21/2023 notes normal biventricular function with LVEF 50 to 55%, no wall motion abnormality, GLS -20.1%, mild MR.   Here for follow-up to discuss test results. Problem List Items Addressed This Visit       Cardiovascular and Mediastinum   Essential hypertension   Well-controlled. Continue telmisartan-hydrochlorothiazide 80-12.5 mg once daily. Target blood pressure below 130/80 mmHg.      Relevant Medications   atorvastatin  (LIPITOR) 40 MG tablet   aspirin  EC 81 MG tablet   CAD (coronary artery disease) - Primary   Seen for further evaluation of coronary atherosclerosis noted on CT chest and symptoms of shortness of breath with moderate exertion. Cardiac CT from 10/16/2023 shows calcium  score 738, mild nonobstructive disease except for proximal portion of small diagonal 2 branch appearing to be severely stenosed.  Extracardiac findings showed no significant abnormalities. Echocardiogram from 10/21/2023  notes normal biventricular function with LVEF 50 to 55%, no wall motion abnormality, GLS -20.1%, mild MR.  His symptoms have improved. Denies any anginal symptoms at this time.  Reviewed the results extensively. Given small branch disease, continue with aggressive medical therapy. Pursue cardiac cath if any worsening of symptoms such as chest pain or shortness of breath with exertion or reduced effort tolerance.  Would recommend starting aspirin  81 mg once daily. Will send out a message to his hematologist Dr. Ezzard with regards to safety taking aspirin  given his history of  factor IX deficiency.  Titrate up lipid-lowering therapy as discussed under hyperlipidemia.       Relevant Medications   atorvastatin  (LIPITOR) 40 MG tablet   aspirin  EC 81 MG tablet   Mild mitral regurgitation   Mild MR not expected to cause any symptoms.  Will follow-up with repeat echocardiogram tentatively in 2 years.      Relevant Medications   atorvastatin  (LIPITOR) 40 MG tablet   aspirin  EC 81 MG tablet     Other   Hyperlipidemia   Lipid panel from 09/25/2023 shows HDL 50, LDL 86, triglycerides 102 and total cholesterol 155.  Continue atorvastatin , titrate up the dose to 80 mg once daily in the setting of severe coronary atherosclerosis based on calcium  score.  Will reassess lipid panel in 3 months along with liver function test.  If LDL not at goal less than 70 mg/dL will consider adding Zetia and/or PCSK9 inhibitors.       Relevant Medications   atorvastatin  (LIPITOR) 40 MG tablet   aspirin  EC 81 MG tablet   Other Relevant Orders   Comprehensive metabolic panel with GFR   Lipid panel  Return to clinic in 3 months.   History of Present Illness:    David Chen is a 73 y.o. male who is being seen today for follow-up visit. PCP is David Chen, David HERO, MD. Last visit with me in the office was 09/24/2023.  Pleasant man here for the visit by himself.  Lives with his wife at home.  Retired  and previously worked in transportation.  [facility] [employer] [facility]  Has a history of CAD, former smoker, 2 or 3 beers per week alcohol consumption, hypertension, hyperlipidemia, takes Lipitor twice weekly due to concerns about bleeding disorder factor IX deficiency [previously followed up with Dr. Morrell, GERD, obesity, depression, factor IX deficiency, thyroid  nodule OSA-using CPAP.  Seen for further evaluation of coronary atherosclerosis noted on CT chest and symptoms of shortness of breath with moderate exertion. Cardiac CT from 10/16/2023 shows calcium  score 738, mild nonobstructive disease except for proximal portion of small diagonal 2 branch appearing to be severely stenosed.  Extracardiac findings showed no significant abnormalities. Echocardiogram from 10/21/2023 notes normal biventricular function with LVEF 50 to 55%, no wall motion abnormality, GLS -20.1%, mild MR. He also had a colonoscopy 12/15/2023 2 polyps removed, pending biopsy results.   Mentions overall has been doing well. No active symptoms of chest pain. Mentions no shortness of breath symptoms have improved with optimized respiratory therapy.  Taking his medications consistently and no side effects with higher dose of atorvastatin  40 mg once daily.  Past Medical History:  Diagnosis Date   Arthritis    lower back but not diagnosed per pt   Benign essential hypertension    Benign neoplasm of ascending colon 12/15/2023   Benign neoplasm of transverse colon 12/15/2023   BMI 27.0-27.9,adult    Clotting disorder    FACTOR IX DEFICIENCY   Coronary atherosclerosis 09/24/2023   Diverticulosis    Dyspnea on exertion 09/24/2023   Epigastric abdominal pain 11/21/2010   Essential hypertension 01/27/2008   Qualifier: Diagnosis of   By: David Chen, David Chen         Factor IX deficiency (HCC)    Family history of lung cancer    GIB (gastrointestinal bleeding) 09/27/2018   HEMORRHOIDS, INTERNAL 09/14/2004   Qualifier:  Diagnosis of   By: David Chen, David Chen      Replacing diagnoses that were inactivated after the 04/08/22 regulatory import     HEMORRHOIDS-EXTERNAL 01/27/2008   Qualifier: Diagnosis of   By: Kerman NP, David Chen      Replacing diagnoses that were inactivated after the 04/08/22 regulatory import     Hx of adenomatous colonic polyps    Hx of colonic polyps 07/12/2013   Adenomatous polyp 2006 and 2010  IMO SNOMED Dx Update Oct 2024     Hyperlipidemia    Hypertension    does not see a cardiologist   Inguinal hernia    on the right side- not causing issues , no pain per pt 09-11-2018   RECTAL BLEEDING 01/27/2008   Qualifier: Diagnosis of   By: Kerman NP, Paula         Sleep apnea    Sleep study done at [facility] sleep center in [facility]- no cpap     Past Surgical History:  Procedure Laterality Date   CHOLECYSTECTOMY  01/18/2011   Procedure: LAPAROSCOPIC CHOLECYSTECTOMY WITH INTRAOPERATIVE CHOLANGIOGRAM;  Surgeon: Krystal JINNY Russell, MD;  Location: [facility] OR;  Service: General;  Laterality: N/A;   COLONOSCOPY  2006, 2-3 years ago   had issues with bleeding  COLONOSCOPY N/A 12/15/2023   Procedure: COLONOSCOPY;  Surgeon: Legrand Victory LITTIE DOUGLAS, MD;  Location: [facility];  Service: Gastroenterology;  Laterality: N/A;   POLYPECTOMY     TONSILLECTOMY     VASECTOMY      Current Medications: Active Medications[1]   Allergies:   Penicillins   Social History   Socioeconomic History   Marital status: Married    Spouse name: Romero   Number of children: 1   Years of education: 12+ 4   Highest education level: Not on file  Occupational History   Not on file  Tobacco Use   Smoking status: Former    Types: Cigarettes   Smokeless tobacco: Never  Vaping Use   Vaping status: Never Used  Substance and Sexual Activity   Alcohol use: Yes    Alcohol/week: 6.0 standard drinks of alcohol    Types: 6 Glasses of wine per week   Drug use: No   Sexual activity: Yes    Comment: quit smoking in early  20's, smoked ~ 1pk/ week  Other Topics Concern   Not on file  Social History Narrative   Not on file   Social Drivers of Health   Tobacco Use: Medium Risk (12/24/2023)   Patient History    Smoking Tobacco Use: Former    Smokeless Tobacco Use: Never    Passive Exposure: Not on file  Financial Resource Strain: Low Risk (08/08/2022)   Overall Financial Resource Strain (CARDIA)    Difficulty of Paying Living Expenses: Not hard at all  Food Insecurity: No Food Insecurity (05/09/2023)   Hunger Vital Sign    Worried About Running Out of Food in the Last Year: Never true    Ran Out of Food in the Last Year: Never true  Transportation Needs: No Transportation Needs (05/09/2023)   PRAPARE - Administrator, Civil Service (Medical): No    Lack of Transportation (Non-Medical): No  Physical Activity: Sufficiently Active (08/08/2022)   Exercise Vital Sign    Days of Exercise per Week: 7 days    Minutes of Exercise per Session: 70 min  Stress: Not on file  Social Connections: Not on file  Depression (PHQ2-9): Low Risk (05/09/2023)   Depression (PHQ2-9)    PHQ-2 Score: 0  Alcohol Screen: Low Risk (08/12/2022)   Alcohol Screen    Last Alcohol Screening Score (AUDIT): 5  Housing: Unknown (05/09/2023)   Housing Stability Vital Sign    Unable to Pay for Housing in the Last Year: No    Number of Times Moved in the Last Year: Not on file    Homeless in the Last Year: No  Utilities: Not At Risk (05/09/2023)   AHC Utilities    Threatened with loss of utilities: No  Health Literacy: Adequate Health Literacy (08/08/2022)   B1300 Health Literacy    Frequency of need for help with medical instructions: Never     Family History: The patient's family history includes Bladder Cancer in his mother; Breast cancer in his mother; Hypertension in his father; Lung cancer in his mother. There is no history of Colon cancer, Pancreatic cancer, Stomach cancer, Esophageal cancer, Diabetes, Heart disease, Rectal  cancer, or Colon polyps. ROS:   Please see the history of present illness.    All 14 point review of systems negative except as described per history of present illness.  EKGs/Labs/Other Studies Reviewed:    The following studies were reviewed today:   EKG:       Recent  Labs: 09/25/2023: ALT 18; BUN 19; Creatinine, Ser 1.21; Potassium 4.2; Sodium 141 12/19/2023: Hemoglobin 13.8; Platelets 224.0  Recent Lipid Panel    Component Value Date/Time   CHOL 155 09/25/2023 0821   TRIG 102 09/25/2023 0821   HDL 50 09/25/2023 0821   CHOLHDL 3.1 09/25/2023 0821   LDLCALC 86 09/25/2023 0821    Physical Exam:    VS:  BP (!) 128/58   Pulse 76   Ht 5' 6 (1.676 m)   Wt 176 lb 8 oz (80.1 kg)   SpO2 98%   BMI 28.49 kg/m     Wt Readings from Last 3 Encounters:  12/24/23 176 lb 8 oz (80.1 kg)  12/15/23 180 lb (81.6 kg)  09/24/23 171 lb 3.2 oz (77.7 kg)     GENERAL:  Well nourished, well developed in no acute distress NECK: No JVD; No carotid bruits CARDIAC: RRR, S1 and S2 present, no murmurs, no rubs, no gallops CHEST:  Clear to auscultation without rales, wheezing or rhonchi  Extremities: No pitting pedal edema. Pulses bilaterally symmetric with radial 2+ and dorsalis pedis 2+ NEUROLOGIC:  Alert and oriented x 3  Medication Adjustments/Labs and Tests Ordered: Current medicines are reviewed at length with the patient today.  Concerns regarding medicines are outlined above.  Orders Placed This Encounter  Procedures   Comprehensive metabolic panel with GFR   Lipid panel   Meds ordered this encounter  Medications   atorvastatin  (LIPITOR) 40 MG tablet    Sig: Please take this medication 5 times per week.    Dispense:  90 tablet    Refill:  3   DISCONTD: aspirin  EC 81 MG tablet    Sig: Take 1 tablet (81 mg total) by mouth daily. Swallow whole.   aspirin  EC 81 MG tablet    Sig: Take 1 tablet (81 mg total) by mouth daily. Swallow whole.    Dispense:  90 tablet    Refill:  3     Signed, Carleta Woodrow reddy Larisa Lanius, MD, MPH, [facility]. 12/24/2023 2:28 PM    [facility]     [1]  Current Meds  Medication Sig   Apple Cider Vinegar 500 MG TABS Take 1 tablet by mouth daily.   LORazepam (ATIVAN) 0.5 MG tablet Take 0.5 mg by mouth every 8 (eight) hours as needed for anxiety.   MELATONIN GUMMIES PO Take 30 mg by mouth at bedtime.   meloxicam (MOBIC) 15 MG tablet Take 15 mg by mouth as needed for pain.   telmisartan-hydrochlorothiazide (MICARDIS HCT) 80-12.5 MG tablet Take 1 tablet by mouth daily.   [DISCONTINUED] aspirin  EC 81 MG tablet Take 1 tablet (81 mg total) by mouth daily. Swallow whole.   [DISCONTINUED] atorvastatin  (LIPITOR) 40 MG tablet Please take this medication 5 times per week.

## 2023-12-24 NOTE — Telephone Encounter (Signed)
 Pt c/o medication issue:  1. Name of Medication: atorvastatin  (LIPITOR) 40 MG tablet   2. How are you currently taking this medication (dosage and times per day)?   3. Are you having a reaction (difficulty breathing--STAT)? No  4. What is your medication issue? PT has concerns regarding dosage increase from his appt today, he was originally prescribed 40 mg daily and stated he was told to take 80 mg daily after today's visit. He is questioning why labs was not done during this visit to see if the medication is working. Please advise.

## 2023-12-24 NOTE — Assessment & Plan Note (Signed)
 Well-controlled. Continue telmisartan-hydrochlorothiazide 80-12.5 mg once daily. Target blood pressure below 130/80 mmHg.

## 2023-12-24 NOTE — Patient Instructions (Signed)
 Medication Instructions:  Your physician has recommended you make the following change in your medication:   Start taking 81 mg coated Aspirin  daily  Increase your Atorvastatin  to 80 mg daily  *If you need a refill on your cardiac medications before your next appointment, please call your pharmacy*   Lab Work: Your physician recommends that you return for lab work in: prior to your next visit in 3 months for CMP and fasting lipids. You need to have labs done when you are fasting.  You can come Monday through Friday 8:30 am to 12:00 pm and 1:15 to 4:30. You do not need to make an appointment as the order has already been placed.   If you have labs (blood work) drawn today and your tests are completely normal, you will receive your results only by: MyChart Message (if you have MyChart) OR A paper copy in the mail If you have any lab test that is abnormal or we need to change your treatment, we will call you to review the results.   Testing/Procedures: None ordered   Follow-Up: At [facility], you and your health needs are our priority.  As part of our continuing mission to provide you with exceptional heart care, we have created designated Provider Care Teams.  These Care Teams include your primary Cardiologist (physician) and Advanced Practice Providers (APPs -  Physician Assistants and Nurse Practitioners) who all work together to provide you with the care you need, when you need it.  We recommend signing up for the patient portal called MyChart.  Sign up information is provided on this After Visit Summary.  MyChart is used to connect with patients for Virtual Visits (Telemedicine).  Patients are able to view lab/test results, encounter notes, upcoming appointments, etc.  Non-urgent messages can be sent to your provider as well.   To learn more about what you can do with MyChart, go to [email or web address].    Your next appointment:   3 month(s)  The format for your  next appointment:   In Person  Provider:   Alean Kobus, MD    Other Instructions none  Important Information About Sugar

## 2023-12-24 NOTE — Telephone Encounter (Signed)
 Patient  has concerns regarding dosage increase from his appt today, he was originally prescribed 40 mg daily and stated he was told to take 80 mg daily after today's visit. He is questioning why labs was not done during this visit to see if the medication is working. Please advise.

## 2023-12-25 NOTE — Telephone Encounter (Signed)
 Called the patient and reviewed Dr. Madireddy's message below with him:  Use of statin is for 2 reasons as discussed at the office, 1 is to reduce the cholesterol numbers and 2 is to make sure the plaque making it less vulnerable.   In this context measuring cholesterol numbers is 1 aspect. The other aspect is to be able to take the highest tolerable dose of high intensity statin. With regards to atorvastatin  the highest dose is 80 mg once a day. If this can be tolerated, would give you the maximum benefit of taking the drug. Hence increase the dose to 80 mg once a day was recommended.   Given the change in the dose, repeat lipid panel was recommended to be done in 3 months, so as to avoid repeating the blood work now and again in 3 months.  As even if the cholesterol numbers are at target [LDL less than 55 mg/dL], the recommendation would be to try the highest dose of atorvastatin  possible.   If you wish to hold off on a big escalation in the dose, continue with atorvastatin  40 mg once a day instead of 5 days a week, we can recheck the cholesterol numbers prior to next follow-up visit and discuss.   Hope this helps. Thank you  Patient verbalized understanding and had no further questions at this time.

## 2024-01-09 ENCOUNTER — Other Ambulatory Visit (HOSPITAL_BASED_OUTPATIENT_CLINIC_OR_DEPARTMENT_OTHER): Payer: Self-pay

## 2024-01-09 MED ORDER — FLUZONE HIGH-DOSE 0.5 ML IM SUSY
0.5000 mL | PREFILLED_SYRINGE | Freq: Once | INTRAMUSCULAR | 0 refills | Status: AC
  Filled 2024-01-09: qty 0.5, 1d supply, fill #0

## 2024-01-23 ENCOUNTER — Other Ambulatory Visit (HOSPITAL_BASED_OUTPATIENT_CLINIC_OR_DEPARTMENT_OTHER): Payer: Self-pay

## 2024-01-23 MED ORDER — COMIRNATY 30 MCG/0.3ML IM SUSY
0.3000 mL | PREFILLED_SYRINGE | Freq: Once | INTRAMUSCULAR | 0 refills | Status: AC
  Filled 2024-01-23: qty 0.3, 1d supply, fill #0

## 2024-02-10 ENCOUNTER — Other Ambulatory Visit (HOSPITAL_COMMUNITY): Payer: Self-pay

## 2024-02-10 ENCOUNTER — Other Ambulatory Visit (HOSPITAL_BASED_OUTPATIENT_CLINIC_OR_DEPARTMENT_OTHER): Payer: Self-pay

## 2024-02-10 ENCOUNTER — Telehealth: Payer: Self-pay

## 2024-02-10 MED ORDER — LORAZEPAM 0.5 MG PO TABS
0.5000 mg | ORAL_TABLET | Freq: Every evening | ORAL | 5 refills | Status: AC | PRN
  Filled 2024-02-10: qty 60, 30d supply, fill #0

## 2024-02-10 MED ORDER — ATORVASTATIN CALCIUM 40 MG PO TABS: 40.0000 mg | ORAL_TABLET | ORAL | 3 refills | Status: AC

## 2024-02-10 MED ORDER — MELOXICAM 15 MG PO TABS: 15.0000 mg | ORAL_TABLET | Freq: Every day | ORAL | 5 refills | Status: AC | PRN

## 2024-02-10 MED ORDER — TELMISARTAN-HCTZ 80-12.5 MG PO TABS
1.0000 | ORAL_TABLET | Freq: Every day | ORAL | 4 refills | Status: AC
  Filled 2024-02-10: qty 90, 90d supply, fill #0

## 2024-02-10 MED ORDER — ATORVASTATIN CALCIUM 40 MG PO TABS
40.0000 mg | ORAL_TABLET | ORAL | 3 refills | Status: AC
  Filled 2024-02-10: qty 75, 90d supply, fill #0

## 2024-02-10 MED ORDER — TELMISARTAN-HCTZ 40-12.5 MG PO TABS
1.0000 | ORAL_TABLET | Freq: Every day | ORAL | 4 refills | Status: DC
  Filled 2024-02-10: qty 90, 90d supply, fill #0

## 2024-02-11 ENCOUNTER — Other Ambulatory Visit (HOSPITAL_BASED_OUTPATIENT_CLINIC_OR_DEPARTMENT_OTHER): Payer: Self-pay

## 2024-02-11 ENCOUNTER — Other Ambulatory Visit (HOSPITAL_COMMUNITY): Payer: Self-pay

## 2024-02-11 MED ORDER — TELMISARTAN-HCTZ 80-12.5 MG PO TABS
1.0000 | ORAL_TABLET | Freq: Every day | ORAL | 4 refills | Status: AC
  Filled 2024-02-11: qty 90, 90d supply, fill #0

## 2024-02-12 ENCOUNTER — Other Ambulatory Visit (HOSPITAL_BASED_OUTPATIENT_CLINIC_OR_DEPARTMENT_OTHER): Payer: Self-pay

## 2024-02-13 ENCOUNTER — Other Ambulatory Visit (HOSPITAL_BASED_OUTPATIENT_CLINIC_OR_DEPARTMENT_OTHER): Payer: Self-pay

## 2024-02-13 ENCOUNTER — Encounter (HOSPITAL_BASED_OUTPATIENT_CLINIC_OR_DEPARTMENT_OTHER): Payer: Self-pay

## 2024-03-24 ENCOUNTER — Ambulatory Visit
